# Patient Record
Sex: Female | Born: 1990 | Race: White | Hispanic: No | Marital: Single | Smoking: Never smoker
Health system: Southern US, Community
[De-identification: ages and names within clinical notes are randomized; demographics above are authoritative.]

## PROBLEM LIST (undated history)

## (undated) DIAGNOSIS — K219 Gastro-esophageal reflux disease without esophagitis: Secondary | ICD-10-CM

---
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
## 2011-04-10 ENCOUNTER — Emergency Department: Payer: Self-pay | Admitting: Emergency Medicine

## 2011-12-19 ENCOUNTER — Emergency Department: Payer: Self-pay | Admitting: *Deleted

## 2014-02-24 ENCOUNTER — Emergency Department: Payer: Self-pay | Admitting: Internal Medicine

## 2014-04-30 ENCOUNTER — Emergency Department: Payer: Self-pay | Admitting: Internal Medicine

## 2014-07-25 ENCOUNTER — Emergency Department: Payer: Self-pay | Admitting: Emergency Medicine

## 2014-12-29 ENCOUNTER — Ambulatory Visit: Payer: Self-pay | Admitting: Family Medicine

## 2014-12-29 ENCOUNTER — Telehealth: Payer: Self-pay | Admitting: Family Medicine

## 2014-12-29 NOTE — Telephone Encounter (Signed)
Patient did not come in for their appointment today new pt appt.  Please let me know if patient needs to be contacted immediately for follow up or no follow up needed.

## 2014-12-29 NOTE — Telephone Encounter (Signed)
error 

## 2014-12-30 ENCOUNTER — Observation Stay
Admission: EM | Admit: 2014-12-30 | Discharge: 2014-12-30 | Disposition: A | Discharge status: Home / Self Care | Payer: 59 | Attending: Obstetrics and Gynecology | Admitting: Obstetrics and Gynecology

## 2014-12-30 ENCOUNTER — Observation Stay: Payer: 59 | Admitting: Registered Nurse

## 2014-12-30 ENCOUNTER — Encounter: Payer: Self-pay | Admitting: Emergency Medicine

## 2014-12-30 ENCOUNTER — Encounter
Admission: EM | Disposition: A | Discharge status: Home / Self Care | Payer: Self-pay | Source: Home / Self Care | Attending: Emergency Medicine

## 2014-12-30 ENCOUNTER — Emergency Department: Payer: 59

## 2014-12-30 DIAGNOSIS — N8329 Other ovarian cysts: Secondary | ICD-10-CM | POA: Insufficient documentation

## 2014-12-30 DIAGNOSIS — R102 Pelvic and perineal pain: Secondary | ICD-10-CM | POA: Diagnosis not present

## 2014-12-30 DIAGNOSIS — O209 Hemorrhage in early pregnancy, unspecified: Secondary | ICD-10-CM | POA: Diagnosis not present

## 2014-12-30 DIAGNOSIS — R1032 Left lower quadrant pain: Secondary | ICD-10-CM | POA: Insufficient documentation

## 2014-12-30 DIAGNOSIS — O009 Unspecified ectopic pregnancy without intrauterine pregnancy: Secondary | ICD-10-CM

## 2014-12-30 DIAGNOSIS — O3481 Maternal care for other abnormalities of pelvic organs, first trimester: Secondary | ICD-10-CM | POA: Insufficient documentation

## 2014-12-30 DIAGNOSIS — O001 Tubal pregnancy: Secondary | ICD-10-CM | POA: Diagnosis not present

## 2014-12-30 DIAGNOSIS — Z3A08 8 weeks gestation of pregnancy: Secondary | ICD-10-CM | POA: Diagnosis not present

## 2014-12-30 HISTORY — DX: Gastro-esophageal reflux disease without esophagitis: K21.9

## 2014-12-30 HISTORY — PX: DIAGNOSTIC LAPAROSCOPY WITH REMOVAL OF ECTOPIC PREGNANCY: SHX6449

## 2014-12-30 LAB — CBC WITH DIFFERENTIAL/PLATELET
BASOS ABS: 0.1 10*3/uL (ref 0–0.1)
BASOS PCT: 1 %
EOS ABS: 0.5 10*3/uL (ref 0–0.7)
Eosinophils Relative: 6 %
HCT: 37.1 % (ref 35.0–47.0)
Hemoglobin: 12.6 g/dL (ref 12.0–16.0)
LYMPHS ABS: 3.1 10*3/uL (ref 1.0–3.6)
LYMPHS PCT: 33 %
MCH: 29.7 pg (ref 26.0–34.0)
MCHC: 33.8 g/dL (ref 32.0–36.0)
MCV: 87.9 fL (ref 80.0–100.0)
MONO ABS: 0.6 10*3/uL (ref 0.2–0.9)
Monocytes Relative: 7 %
Neutro Abs: 5.2 10*3/uL (ref 1.4–6.5)
Neutrophils Relative %: 55 %
Platelets: 322 10*3/uL (ref 150–440)
RBC: 4.23 MIL/uL (ref 3.80–5.20)
RDW: 12.5 % (ref 11.5–14.5)
WBC: 9.4 10*3/uL (ref 3.6–11.0)

## 2014-12-30 LAB — COMPREHENSIVE METABOLIC PANEL
ALT: 23 U/L (ref 14–54)
AST: 18 U/L (ref 15–41)
Albumin: 4.2 g/dL (ref 3.5–5.0)
Alkaline Phosphatase: 33 U/L — ABNORMAL LOW (ref 38–126)
Anion gap: 7 (ref 5–15)
BUN: 16 mg/dL (ref 6–20)
CO2: 26 mmol/L (ref 22–32)
Calcium: 8.9 mg/dL (ref 8.9–10.3)
Chloride: 105 mmol/L (ref 101–111)
Creatinine, Ser: 0.62 mg/dL (ref 0.44–1.00)
GFR calc non Af Amer: >60 mL/min (ref >60)
GLUCOSE: 130 mg/dL — AB (ref 65–99)
Potassium: 3.7 mmol/L (ref 3.5–5.1)
Sodium: 138 mmol/L (ref 135–145)
Total Bilirubin: 0.3 mg/dL (ref 0.3–1.2)
Total Protein: 7.3 g/dL (ref 6.5–8.1)

## 2014-12-30 LAB — URINALYSIS COMPLETE WITH MICROSCOPIC (ARMC ONLY)
Bilirubin Urine: NEGATIVE
Glucose, UA: NEGATIVE mg/dL
Nitrite: NEGATIVE
Protein, ur: 30 mg/dL — AB
Specific Gravity, Urine: 1.033 — ABNORMAL HIGH (ref 1.005–1.030)
pH: 5 (ref 5.0–8.0)

## 2014-12-30 LAB — ABO/RH: ABO/RH(D): A POS

## 2014-12-30 LAB — HCG, QUANTITATIVE, PREGNANCY: hCG, Beta Chain, Quant, S: 2179 m[IU]/mL — ABNORMAL HIGH (ref <5)

## 2014-12-30 LAB — POCT PREGNANCY, URINE: PREG TEST UR: POSITIVE — AB

## 2014-12-30 SURGERY — LAPAROSCOPY, WITH ECTOPIC PREGNANCY SURGICAL TREATMENT
Anesthesia: General | Laterality: Left

## 2014-12-30 MED ORDER — ROCURONIUM BROMIDE 100 MG/10ML IV SOLN
INTRAVENOUS | Status: DC | PRN
  Administered 2014-12-30: 10 mg via INTRAVENOUS
  Administered 2014-12-30: 30 mg via INTRAVENOUS
  Administered 2014-12-30: 10 mg via INTRAVENOUS

## 2014-12-30 MED ORDER — HYDROMORPHONE HCL 1 MG/ML IJ SOLN
INTRAMUSCULAR | Status: AC
  Administered 2014-12-30: 0.5 mg via INTRAVENOUS
  Filled 2014-12-30: qty 1

## 2014-12-30 MED ORDER — ACETAMINOPHEN 10 MG/ML IV SOLN
INTRAVENOUS | Status: AC
  Filled 2014-12-30: qty 100

## 2014-12-30 MED ORDER — HYDROMORPHONE HCL 1 MG/ML IJ SOLN
0.2500 mg | INTRAMUSCULAR | Status: DC | PRN
  Administered 2014-12-30 (×4): 0.5 mg via INTRAVENOUS

## 2014-12-30 MED ORDER — BUPIVACAINE HCL 0.5 % IJ SOLN
INTRAMUSCULAR | Status: DC | PRN
  Administered 2014-12-30: 22 mL

## 2014-12-30 MED ORDER — OXYCODONE-ACETAMINOPHEN 5-325 MG PO TABS: 1.0000 | ORAL_TABLET | ORAL | Status: DC | PRN

## 2014-12-30 MED ORDER — OXYCODONE-ACETAMINOPHEN 5-325 MG PO TABS
1.0000 | ORAL_TABLET | ORAL | Status: DC | PRN
  Administered 2014-12-30 (×2): 1 via ORAL
  Filled 2014-12-30 (×2): qty 1

## 2014-12-30 MED ORDER — LACTATED RINGERS IR SOLN
Status: DC | PRN
  Administered 2014-12-30: 150 mL

## 2014-12-30 MED ORDER — ONDANSETRON HCL 4 MG/2ML IJ SOLN
4.0000 mg | Freq: Once | INTRAMUSCULAR | Status: AC | PRN
  Administered 2014-12-30: 4 mg via INTRAVENOUS
  Filled 2014-12-30: qty 2

## 2014-12-30 MED ORDER — MORPHINE SULFATE 4 MG/ML IJ SOLN
INTRAMUSCULAR | Status: AC
  Filled 2014-12-30: qty 1

## 2014-12-30 MED ORDER — GLYCOPYRROLATE 0.2 MG/ML IJ SOLN
INTRAMUSCULAR | Status: DC | PRN
  Administered 2014-12-30: 0.6 mg via INTRAVENOUS

## 2014-12-30 MED ORDER — BUPIVACAINE HCL (PF) 0.5 % IJ SOLN
INTRAMUSCULAR | Status: AC
  Filled 2014-12-30: qty 30

## 2014-12-30 MED ORDER — DIPHENHYDRAMINE HCL 50 MG/ML IJ SOLN
INTRAMUSCULAR | Status: AC
  Administered 2014-12-30: 25 mg via INTRAVENOUS
  Filled 2014-12-30: qty 1

## 2014-12-30 MED ORDER — DOXYCYCLINE HYCLATE 100 MG IV SOLR
100.0000 mg | INTRAVENOUS | Status: DC | PRN
  Administered 2014-12-30: 100 mg via INTRAVENOUS

## 2014-12-30 MED ORDER — KETOROLAC TROMETHAMINE 30 MG/ML IJ SOLN
INTRAMUSCULAR | Status: DC | PRN
  Administered 2014-12-30: 30 mg via INTRAVENOUS

## 2014-12-30 MED ORDER — MIDAZOLAM HCL 2 MG/2ML IJ SOLN
INTRAMUSCULAR | Status: DC | PRN
  Administered 2014-12-30: 2 mg via INTRAVENOUS

## 2014-12-30 MED ORDER — DOCUSATE SODIUM 100 MG PO CAPS: 100.0000 mg | ORAL_CAPSULE | Freq: Two times a day (BID) | ORAL | Status: DC

## 2014-12-30 MED ORDER — DIPHENHYDRAMINE HCL 50 MG/ML IJ SOLN
25.0000 mg | Freq: Once | INTRAMUSCULAR | Status: AC
  Administered 2014-12-30: 25 mg via INTRAVENOUS

## 2014-12-30 MED ORDER — FENTANYL CITRATE (PF) 100 MCG/2ML IJ SOLN
INTRAMUSCULAR | Status: DC | PRN
  Administered 2014-12-30: 50 ug via INTRAVENOUS
  Administered 2014-12-30: 100 ug via INTRAVENOUS
  Administered 2014-12-30: 50 ug via INTRAVENOUS

## 2014-12-30 MED ORDER — DOXYCYCLINE HYCLATE 100 MG IV SOLR
100.0000 mg | Freq: Once | INTRAVENOUS | Status: DC
  Filled 2014-12-30: qty 100

## 2014-12-30 MED ORDER — LIDOCAINE HCL (CARDIAC) 20 MG/ML IV SOLN
INTRAVENOUS | Status: DC | PRN
  Administered 2014-12-30: 80 mg via INTRAVENOUS

## 2014-12-30 MED ORDER — LACTATED RINGERS IV SOLN
INTRAVENOUS | Status: DC
  Administered 2014-12-30: 17:00:00 via INTRAVENOUS

## 2014-12-30 MED ORDER — HYDROMORPHONE HCL 1 MG/ML IJ SOLN
1.0000 mg | Freq: Once | INTRAMUSCULAR | Status: AC
  Administered 2014-12-30: 1 mg via INTRAVENOUS
  Filled 2014-12-30: qty 1

## 2014-12-30 MED ORDER — ONDANSETRON HCL 4 MG/2ML IJ SOLN
4.0000 mg | Freq: Once | INTRAMUSCULAR | Status: AC
  Administered 2014-12-30: 4 mg via INTRAVENOUS
  Filled 2014-12-30: qty 2

## 2014-12-30 MED ORDER — ACETAMINOPHEN 10 MG/ML IV SOLN
INTRAVENOUS | Status: DC | PRN
  Administered 2014-12-30: 1000 mg via INTRAVENOUS

## 2014-12-30 MED ORDER — DIPHENHYDRAMINE HCL 50 MG/ML IJ SOLN: 25.0000 mg | Freq: Four times a day (QID) | INTRAMUSCULAR | Status: DC | PRN

## 2014-12-30 MED ORDER — LACTATED RINGERS IV SOLN
INTRAVENOUS | Status: DC | PRN
  Administered 2014-12-30: 12:00:00 via INTRAVENOUS

## 2014-12-30 MED ORDER — SUCCINYLCHOLINE CHLORIDE 20 MG/ML IJ SOLN
INTRAMUSCULAR | Status: DC | PRN
  Administered 2014-12-30: 100 mg via INTRAVENOUS

## 2014-12-30 MED ORDER — ONDANSETRON HCL 4 MG/2ML IJ SOLN
INTRAMUSCULAR | Status: DC | PRN
  Administered 2014-12-30: 4 mg via INTRAVENOUS

## 2014-12-30 MED ORDER — PROPOFOL 10 MG/ML IV BOLUS
INTRAVENOUS | Status: DC | PRN
  Administered 2014-12-30: 200 mg via INTRAVENOUS

## 2014-12-30 MED ORDER — ONDANSETRON HCL 4 MG/2ML IJ SOLN
4.0000 mg | Freq: Once | INTRAMUSCULAR | Status: AC
  Administered 2014-12-30: 4 mg via INTRAVENOUS

## 2014-12-30 MED ORDER — HYDROMORPHONE HCL 1 MG/ML IJ SOLN: 0.5000 mg | INTRAMUSCULAR | Status: DC | PRN

## 2014-12-30 MED ORDER — METOCLOPRAMIDE HCL 5 MG/ML IJ SOLN: 10.0000 mg | Freq: Once | INTRAMUSCULAR | Status: DC | PRN

## 2014-12-30 MED ORDER — NEOSTIGMINE METHYLSULFATE 10 MG/10ML IV SOLN
INTRAVENOUS | Status: DC | PRN
  Administered 2014-12-30: 3 mg via INTRAVENOUS

## 2014-12-30 MED ORDER — FENTANYL CITRATE (PF) 100 MCG/2ML IJ SOLN
50.0000 ug | Freq: Once | INTRAMUSCULAR | Status: AC
  Administered 2014-12-30: 50 ug via INTRAVENOUS
  Filled 2014-12-30: qty 2

## 2014-12-30 MED ORDER — OXYCODONE-ACETAMINOPHEN 5-325 MG PO TABS: 1.0000 | ORAL_TABLET | Freq: Four times a day (QID) | ORAL | Status: DC | PRN

## 2014-12-30 MED ORDER — ONDANSETRON HCL 4 MG/2ML IJ SOLN
INTRAMUSCULAR | Status: AC
  Filled 2014-12-30: qty 2

## 2014-12-30 MED ORDER — DEXAMETHASONE SODIUM PHOSPHATE 4 MG/ML IJ SOLN
INTRAMUSCULAR | Status: DC | PRN
  Administered 2014-12-30: 8 mg via INTRAVENOUS

## 2014-12-30 MED ORDER — FENTANYL CITRATE (PF) 100 MCG/2ML IJ SOLN
50.0000 ug | Freq: Once | INTRAMUSCULAR | Status: AC
Start: 2014-12-30 — End: 2014-12-30
  Administered 2014-12-30: 50 ug via INTRAVENOUS
  Filled 2014-12-30: qty 2

## 2014-12-30 MED ORDER — LACTATED RINGERS IV SOLN: INTRAVENOUS | Status: DC

## 2014-12-30 MED ORDER — MORPHINE SULFATE 4 MG/ML IJ SOLN
4.0000 mg | Freq: Once | INTRAMUSCULAR | Status: AC
  Administered 2014-12-30: 4 mg via INTRAVENOUS
  Filled 2014-12-30: qty 1

## 2014-12-30 SURGICAL SUPPLY — 52 items
11 MM BLADLESS TROCAR ×3 IMPLANT
BAG URO DRAIN 2000ML W/SPOUT (MISCELLANEOUS) ×3 IMPLANT
BLADE SURG 15 STRL LF DISP TIS (BLADE) ×1 IMPLANT
BLADE SURG 15 STRL SS (BLADE) ×2
CANISTER SUCT 1200ML W/VALVE (MISCELLANEOUS) ×3 IMPLANT
CATH FOLEY 2WAY  5CC 16FR (CATHETERS) ×2
CATH ROBINSON RED A/P 16FR (CATHETERS) ×3 IMPLANT
CATH URTH 16FR FL 2W BLN LF (CATHETERS) ×1 IMPLANT
CHLORAPREP W/TINT 26ML (MISCELLANEOUS) ×3 IMPLANT
CORD MONOPOLAR M/FML 12FT (MISCELLANEOUS) ×3 IMPLANT
DEFOGGER SCOPE WARMER CLEARIFY (MISCELLANEOUS) ×3 IMPLANT
DRESSING SURGICEL FIBRLLR 1X2 (HEMOSTASIS) ×1 IMPLANT
DRESSING TELFA 4X3 1S ST N-ADH (GAUZE/BANDAGES/DRESSINGS) ×3 IMPLANT
DRSG SURGICEL FIBRILLAR 1X2 (HEMOSTASIS) ×3
DRSG TEGADERM 2-3/8X2-3/4 SM (GAUZE/BANDAGES/DRESSINGS) ×12 IMPLANT
ENDOPOUCH RETRIEVER 10 (MISCELLANEOUS) IMPLANT
GAUZE SPONGE NON-WVN 2X2 STRL (MISCELLANEOUS) ×2 IMPLANT
GLOVE BIO SURGEON STRL SZ7 (GLOVE) ×6 IMPLANT
GLOVE INDICATOR 7.5 STRL GRN (GLOVE) ×3 IMPLANT
GOWN STRL REUS W/ TWL LRG LVL3 (GOWN DISPOSABLE) ×1 IMPLANT
GOWN STRL REUS W/ TWL XL LVL3 (GOWN DISPOSABLE) ×1 IMPLANT
GOWN STRL REUS W/TWL LRG LVL3 (GOWN DISPOSABLE) ×2
GOWN STRL REUS W/TWL XL LVL3 (GOWN DISPOSABLE) ×2
GRASPER SUT TROCAR 14GX15 (MISCELLANEOUS) ×3 IMPLANT
IRRIGATION STRYKERFLOW (MISCELLANEOUS) ×1 IMPLANT
IRRIGATOR STRYKERFLOW (MISCELLANEOUS) ×3
IV LACTATED RINGERS 1000ML (IV SOLUTION) ×3 IMPLANT
KII SLEEVE ×3 IMPLANT
KIT PINK PAD W/HEAD ARE REST (MISCELLANEOUS) ×3
KIT PINK PAD W/HEAD ARM REST (MISCELLANEOUS) ×1 IMPLANT
KIT RM TURNOVER CYSTO AR (KITS) ×3 IMPLANT
LABEL OR SOLS (LABEL) ×3 IMPLANT
LIGASURE BLUNT 5MM 37CM (INSTRUMENTS) ×3 IMPLANT
LIQUID BAND (GAUZE/BANDAGES/DRESSINGS) ×3 IMPLANT
NS IRRIG 500ML POUR BTL (IV SOLUTION) ×3 IMPLANT
PACK GYN LAPAROSCOPIC (MISCELLANEOUS) ×3 IMPLANT
PAD OB MATERNITY 4.3X12.25 (PERSONAL CARE ITEMS) ×3 IMPLANT
PAD PREP 24X41 OB/GYN DISP (PERSONAL CARE ITEMS) ×3 IMPLANT
POUCH ENDO CATCH 10MM SPEC (MISCELLANEOUS) ×3 IMPLANT
SCISSORS METZENBAUM CVD 33 (INSTRUMENTS) ×3 IMPLANT
SLEEVE ENDOPATH XCEL 5M (ENDOMECHANICALS) IMPLANT
SPONGE VERSALON 2X2 STRL (MISCELLANEOUS) ×4
SUT VIC AB 4-0 PS2 18 (SUTURE) ×3 IMPLANT
SUT VICRYL 0 AB UR-6 (SUTURE) ×3 IMPLANT
SYRINGE 10CC LL (SYRINGE) ×3 IMPLANT
TROCAR 130MM GELPORT  DAV (MISCELLANEOUS) ×3 IMPLANT
TROCAR BLUNT TIP 12MM OMST12BT (TROCAR) IMPLANT
TROCAR XCEL NON-BLD 11X100MML (ENDOMECHANICALS) ×6 IMPLANT
TROCAR XCEL NON-BLD 5MMX100MML (ENDOMECHANICALS) ×3 IMPLANT
TROCAR XCEL UNIV SLVE 11M 100M (ENDOMECHANICALS) IMPLANT
TROCAR Z-THREAD OPTICAL 5X100M (TROCAR) ×3 IMPLANT
TUBING INSUFFLATOR HI FLOW (MISCELLANEOUS) ×3 IMPLANT

## 2014-12-30 NOTE — ED Provider Notes (Signed)
[facility] Emergency Department Provider Note  ____________________________________________  Time seen:   I have reviewed the triage vital signs and the nursing notes.   HISTORY  Chief Complaint Abdominal Pain and Nausea      HPI Brittany Cabrera is a 24 y.o. female presents with pelvic pain and nausea 2 days. Patient unsure as to last menstrual period states that her menses have been very regular however she states that she's had vaginal spotting since Friday intermittently. Patient denies any fever no vomiting or diarrhea.   Past medical history 1 previous pregnancy vaginal delivery   There are no active problems to display for this patient.   Past surgical history None No current outpatient prescriptions on file.  Allergies Review of patient's allergies indicates no known allergies.  No family history on file.  Social History History  Substance Use Topics  . Smoking status: Never Smoker   . Smokeless tobacco: Never Used  . Alcohol Use: Yes    Review of Systems  Constitutional: Negative for fever. Eyes: Negative for visual changes. ENT: Negative for sore throat. Cardiovascular: Negative for chest pain. Respiratory: Negative for shortness of breath. Gastrointestinal: Positive for pelvic pain Genitourinary: Negative for dysuria. Musculoskeletal: Negative for back pain. Skin: Negative for rash. Neurological: Negative for headaches, focal weakness or numbness.   10-point ROS otherwise negative.  ____________________________________________   PHYSICAL EXAM:  VITAL SIGNS: ED Triage Vitals  Enc Vitals Group     BP 12/30/14 0218 106/65 mmHg     Pulse Rate 12/30/14 0218 77     Resp 12/30/14 0218 18     Temp 12/30/14 0218 98.1 F (36.7 C)     Temp Source 12/30/14 0218 Oral     SpO2 12/30/14 0218 99 %     Weight 12/30/14 0218 210 lb (95.255 kg)     Height 12/30/14 0218  (1.6 m)     Head Cir --      Peak Flow --       Pain Score 12/30/14 0219 9     Pain Loc --      Pain Edu? --      Excl. in GC? --      Constitutional: Alert and oriented. Well appearing and in no distress. Eyes: Conjunctivae are normal. PERRL. Normal extraocular movements. ENT   Head: Normocephalic and atraumatic.   Nose: No congestion/rhinnorhea.   Mouth/Throat: Mucous membranes are moist.   Neck: No stridor. Cardiovascular: Normal rate, regular rhythm. Normal and symmetric distal pulses are present in all extremities. No murmurs, rubs, or gallops. Respiratory: Normal respiratory effort without tachypnea nor retractions. Breath sounds are clear and equal bilaterally. No wheezes/rales/rhonchi. Gastrointestinal: Right lower quadrant/left lower quadrant and suprapubic pain with palpation.. No distention. There is no CVA tenderness. Genitourinary: deferred Musculoskeletal: Nontender with normal range of motion in all extremities. No joint effusions.  No lower extremity tenderness nor edema. Neurologic:  Normal speech and language. No gross focal neurologic deficits are appreciated. Speech is normal.  Skin:  Skin is warm, dry and intact. No rash noted. Psychiatric: Mood and affect are normal. Speech and behavior are normal. Patient exhibits appropriate insight and judgment.  ____________________________________________    LABS (pertinent positives/negatives)  Labs Reviewed  COMPREHENSIVE METABOLIC PANEL - Abnormal; Notable for the following:    Glucose, Bld 130 (*)    Alkaline Phosphatase 33 (*)    All other components within normal limits  URINALYSIS COMPLETEWITH MICROSCOPIC ([facility] ONLY) - Abnormal; Notable for the following:  Color, Urine YELLOW (*)    APPearance HAZY (*)    Ketones, ur TRACE (*)    Specific Gravity, Urine 1.033 (*)    Hgb urine dipstick 3+ (*)    Protein, ur 30 (*)    Leukocytes, UA TRACE (*)    Bacteria, UA RARE (*)    Squamous Epithelial / LPF 0-5 (*)    All other components within  normal limits  POCT PREGNANCY, URINE - Abnormal; Notable for the following:    Preg Test, Ur POSITIVE (*)    All other components within normal limits  CBC WITH DIFFERENTIAL/PLATELET  HCG, QUANTITATIVE, PREGNANCY        RADIOLOGY  Pelvic ultrasound revealed: IMPRESSION: 1. No IUP is observed. There are no findings to suggest retained products of conception. 2. There is a complex left adnexal mass measuring 3.1 x 2.4 x 2.7 with some increased vascularity without a classic ring of fire. This may reflect an ectopic pregnancy. 3. The ovaries are normal in size and echotexture. 4. There is no free pelvic fluid. These results were called by telephone at the time of interpretation on 12/30/2014 at 7:40 am to Dr. Dorothea Glassman, who verbally acknowledged these results.    Critical Care performed: 30 minutes  ____________________________________________   INITIAL IMPRESSION / ASSESSMENT AND PLAN / ED COURSE  Pertinent labs & imaging results that were available during my care of the patient were reviewed by me and considered in my medical decision making (see chart for details).    ____________________________________________   FINAL CLINICAL IMPRESSION(S) / ED DIAGNOSES  Final diagnoses:  Ectopic pregnancy      Darci Current, MD 12/31/14 [phone]

## 2014-12-30 NOTE — Progress Notes (Signed)
GYN Note Chart reviewed.  Concerning for left sided ectopic. Plan of care discussed with patient and recommend left salpingectomy, if all other anatomy is normal and D&C for thickened endometrial stripe. Pt amenable to plan. Will proceed for surgery at around noon. Pain stable. NPO since 0500 (water for u/s) and no food since yesterday. Desires nothing for [facility] after procedure. Rh pos, CBC normal  Cornelia Copa MD [facility]  Pager: [phone]

## 2014-12-30 NOTE — Discharge Instructions (Signed)
Call for a 2-3 week post op visit with Dr. Vergie Living.   Brittany Cabrera Laparoscopic Surgery Discharge Instructions  Instructions Following Laparoscopic Surgery You have just undergone a major laparoscopic surgery.  The following list should answer your most common questions.  Although we will discuss your surgery and post-operative instructions with you prior to your discharge, this list will serve as a reminder if you fail to recall the details of what we discussed.  We will discuss your surgery once again in detail at your post-op visit in two to four weeks. If you havent already done so, please call to make your appointment as soon as possible.  How you will feel: Although you have just undergone a major surgery, your recovery will be significantly shorter since the surgery was performed through much smaller incisions than the traditional approach.  You should feel slightly better each day.  If you suddenly feel much worse than the prior day, please call the clinic.  Its important during the early part of your recovery that you maintain some activity.  Walking is encouraged.  You will quicken your recovery by continued activity.  Incision:  Your incisions will be closed with dissolvable stitches or surgical adhesive (glue).  There may be Band-aids and/or Steri-strips covering your incisions.  If there is no drainage from the incisions you may remove the Band-aids in one to two days.  You may notice some minor bruising at the incision sites.  This is common and will resolve within several days.  Please inform us if the redness at the edges of your incision appears to be spreading.  If the skin around your incision becomes warm to the touch, or if you notice a pus-like drainage, please call the office.  Sexual Activity: Do not have sexual intercourse or place tampons or douches in the vagina prior to your first office visit.  We will discuss when you may resume these activities at that visit.     Stairs/Driving/Activities: You may climb stairs if necessary.  If youve had general anesthesia, do not drive a car the rest of the day today.  You may begin light housework when you feel up to it, but avoid heavy lifting (more than 15-20lbs) or pushing until cleared for these activities by your physician.  Hygiene:  Do not soak your incisions.  Showers are acceptable but you may not take a bath or swim in a pool.  Cleanse your incisions daily with soap and water.  Medications:  Please resume taking any medications that you were taking prior to the surgery.  If we have prescribed any new medications for you, please take them as directed.  Constipation:  It is fairly common to experience some difficulty in moving your bowels following major surgery.  Being active will help to reduce this likelihood. A diet rich in fiber and plenty of liquids is desirable.  If you do become constipated, a mild laxative such as Miralax, Milk of Magnesia, or Metamucil, or a stool softener such as Colace, is recommended.  General Instructions: If you develop a fever of 100.5 degrees or higher, please call the office number(s) below for physician on call.    We will discuss your surgery once again in detail at your post-op visit in two to four weeks. If you havent already done so, please call to make your appointment as soon as possible.  [facility]  [address]  [place], [place] [address] [place], [place] [address]  Phone:  [phone] Phone: [phone]  Fax: [phone] Fax: [phone]

## 2014-12-30 NOTE — Progress Notes (Signed)
Vital signs WDL. Tolerating food, liquids, and PO meds. Voiding adequately. Pain well controlled. Incisions WDL, minimal vaginal bleeding. Patient discharged to home via wheelchair escorted by nursing. Prescriptions and discharged instructions given to and reviewed with patient. Patient verbalized understanding of all instructions.

## 2014-12-30 NOTE — ED Notes (Signed)
Pt uprite on stretcher in exam room with no distress noted; SO at bedside; pt reports left lower abd pain since yesterday accomp by nausea; describes pain as dull; +BS, abd soft/nondist, tender left lower; st currently menstruating

## 2014-12-30 NOTE — ED Notes (Signed)
Pt to u/s via w/c

## 2014-12-30 NOTE — Anesthesia Postprocedure Evaluation (Signed)
  Anesthesia Post-op Note  Patient: Brittany Cabrera  Procedure(s) Performed: Procedure(s): D7C, DIAGNOSTIC LAPAROSCOPY, LEFT SALPINGECTOMY, RIGHT PARA OVARIAN CYSTECTOMY (Left)  Anesthesia type:General  Patient location: PACU  Post pain: Pain level controlled  Post assessment: Post-op Vital signs reviewed, Patient's Cardiovascular Status Stable, Respiratory Function Stable, Patent Airway and No signs of Nausea or vomiting  Post vital signs: Reviewed and stable  Last Vitals:  Filed Vitals:   12/30/14 1449  BP:   Pulse: 71  Temp: 36.8 C  Resp:     Level of consciousness: awake, alert  and patient cooperative  Complications: No apparent anesthesia complications

## 2014-12-30 NOTE — Progress Notes (Signed)
Pt c/o itching. Benadryl given as ordered.

## 2014-12-30 NOTE — Anesthesia Postprocedure Evaluation (Signed)
  Anesthesia Post-op Note  Patient: Brittany Cabrera  Procedure(s) Performed: Procedure(s): D7C, DIAGNOSTIC LAPAROSCOPY, LEFT SALPINGECTOMY, RIGHT PARA OVARIAN CYSTECTOMY (Left)  Anesthesia type:General  Patient location: PACU  Post pain: Pain level controlled  Post assessment: Post-op Vital signs reviewed, Patient's Cardiovascular Status Stable, Respiratory Function Stable, Patent Airway and No signs of Nausea or vomiting  Post vital signs: Reviewed and stable  Last Vitals:  Filed Vitals:   12/30/14 1146  BP: 113/73  Pulse: 72  Temp: 37 C  Resp: 18    Level of consciousness: awake, alert  and patient cooperative  Complications: No apparent anesthesia complications

## 2014-12-30 NOTE — ED Notes (Signed)
Pt to the restroom w/ episode of vomting - pt given IVP zofran per protocol

## 2014-12-30 NOTE — ED Notes (Signed)
MD at bedside. 

## 2014-12-30 NOTE — ED Notes (Signed)
Pt presents to ED with left lower abd pain and nausea. Denies vomiting. Pt reports her pain is sharp and cramping in nature; pain radiates up her side and around her back.

## 2014-12-30 NOTE — ED Notes (Signed)
Pt states she is still unable to provide urine sample.  

## 2014-12-30 NOTE — Progress Notes (Signed)
Pt back from surgery; in room 349

## 2014-12-30 NOTE — ED Provider Notes (Signed)
Ultrasound comes back extremely suspicious for ectopic there is no pregnancy in the uterus there is a mass 3.1 cm the adnexa. [facility] called comes in to examine the patient will admit the patient to work on the ectopic.  Arnaldo Natal, MD 12/30/14 [phone]

## 2014-12-30 NOTE — Op Note (Addendum)
Operative Note   12/30/2014  PRE-OP DIAGNOSIS: Vaginal bleeding, left lower quadrant pain, left adnexal mass, positive pregnancy test, negative endometrium on ultrasound   POST-OP DIAGNOSIS: Same. Left tubal ectopic pregnancy. Right para-ovarian cyst  SURGEON: Surgeon(s) and Role:    * [place] Bing, MD - Primary  ASSISTANT: none  ANESTHESIA: General and local  PROCEDURE: Dilation and curettage, left salpingectomy, right para-ovarian cystectomy  ESTIMATED BLOOD LOSS: 25mL intra-operatively. of blood in the abdomen at the start of the case  DRAINS: indwelling foley UOP   TOTAL IV FLUIDS: crystalloid  SPECIMENS: endometrial curettings, left fallopian tube, right para-ovarian cyst  VTE PROPHYLAXIS: SCDs to the bilateral lower extremities  ANTIBIOTICS: Doxycycline 100mg  IV pre operatively  COMPLICATIONS: None  DISPOSITION: PACU - hemodynamically stable.  CONDITION: stable  FINDINGS: Exam under anesthesia revealed an anteverted uterus approximately 8 week size, normal shape, and no adnexal masses; normal EGBUS, cervix and vagina. Laparoscopic survey of the abdomen revealed no intra-abdominal adhesions, a grossly normal uterus, right fallopian tube, bilateral ovaries. Left dilated fallopian tube with adherent blood clot on the fimbriae. Old blood in the posterior and anterior cul-de-sac. 1-1.5cm two para ovarian right cysts, with clear fluid and thin walled (benign appearing)  PROCEDURE IN DETAIL: The patient was taken to the OR where anesthesia was administed. The patient was positioned in dorsal lithotomy in the [place] stirrups. The patient was then examined under anesthesia with the above noted findings. The patient was prepped and draped in the normal sterile fashion and foley catheter was placed. A Graves speculum was placed in the vagina and the anterior lip of the cervix was grasped with a single toothed tenaculum.  A gentle uterine curettage was done and a  hulka uterine manipulator was then inserted in the uterus and uterine mobility was found to be satisfactory; the speculum was then removed.  After changing gloves, attention was turned to the patient's abdomen where a 10 mm skin incision was made in the umbilical fold, after injection of local anesthesia. A skin incision was made and using the open technique, the abdomen was entered, a 12mm umbilical port placed and the laparoscope introduced and pneumoperitoneum obtained with the obtained findings, after inspection below the entry site and Trendelenburg done. A suprapubic and bilateral lower quadrant ports were placed under direct visualization and after injection of local and all these ports were 5mm.  Using the Ligasure, the left fallopian tube was removed and the suction irrigator was used to remove old blood in the abdomen. Next, the para ovarian cyst was examined and noted to be distinct and separate from the right ovary and tube and was gently removed with the monopolar scissors, gentle cut current and then the Ligasure, once a good margin from the tube was obtained.  Pressure was dropped to and the bilateral operative sites were inspected and the left side was normal and right side was slightly oozy with hemostasis obtained with coag current with the scissors and fibrillar.  Pressure was then put back to and using the PMI device, the umbilical fascia was closed, the ports removed under direct visualization and then the gas released before the last one was removed. The umbilical fasica was then tied and confirmed to be closed and the umbilical and suprapubic ports closed with 4-0 vicryl in a subcuticular fashion and then all the ports had skin glue applied.    Hulka was then removed and excellent hemostasis seen with the speculum, and tThe foley catheter  was removed. The patient tolerated the procedure well. All counts were correct x 2. The patient was transferred to the recovery room awake,  alert and breathing independently.   Cornelia Copa MD [facility]  Pager: [phone]

## 2014-12-30 NOTE — Transfer of Care (Signed)
Immediate Anesthesia Transfer of Care Note  Patient: Brittany Cabrera  Procedure(s) Performed: Procedure(s): D7C, DIAGNOSTIC LAPAROSCOPY, LEFT SALPINGECTOMY, RIGHT PARA OVARIAN CYSTECTOMY (Left)  Patient Location: PACU  Anesthesia Type:General  Level of Consciousness: sedated  Airway & Oxygen Therapy: Patient Spontanous Breathing and Patient connected to face mask oxygen  Post-op Assessment: Report given to RN and Post -op Vital signs reviewed and stable  Post vital signs: Reviewed and stable  Last Vitals:  Filed Vitals:   12/30/14 1449  BP: 112/64  Pulse: 71  Temp: 36.8 C  Resp: 16    Complications: No apparent anesthesia complications

## 2014-12-30 NOTE — H&P (Signed)
Obstetrics & Gynecology Consultation Note  Date of Consultation: 12/30/2014   Requesting Provider: [facility]  Primary OBGYN: None.  Seen [facility] 2 years ago. Primary Care Provider: No PCP Per Patient  Reason for Consultation: Left Ectopic Pregnancy  History of Present Illness: Ms. Hilgeman is a 24 y.o. G2P1. (Patient's last menstrual period was 12/25/2014.), with the above CC. Pt started bleeding but it was different from period, w worsening bleeding today.  More importantly, she awoke with severe LLQ pain during the night.  No n/v/f/c.  No prior medical problems.  NSVD x1 eight years ago.  Beta 2100 [place]- empty uterus, 3cm left adnexal mass adjacent to ovary  ROS: A 12-point review of systems was performed and negative, except as stated in the above HPI.  OBGYN History: As per HPI. OB History    No data available     no history of abnormal pap smears (last pap smear: 2 years ago, which was normal.) no history of STIs.   She is currently using nothing for contraception.  no HRT use.    Past Medical History: History reviewed. No pertinent past medical history.  Past Surgical History: History reviewed. No pertinent past surgical history.  Family History:  No family history on file. She denies any female cancers, bleeding or blood clotting disorders.   Social History:  History   Social History  . Marital Status: Single    Spouse Name: N/A  . Number of Children: N/A  . Years of Education: N/A   Occupational History  . Not on file.   Social History Main Topics  . Smoking status: Never Smoker   . Smokeless tobacco: Never Used  . Alcohol Use: Yes  . Drug Use: No  . Sexual Activity: Not on file   Other Topics Concern  . Not on file   Social History Narrative  . No narrative on file    Health Maintenance:  Mammogram no, ;  Colonoscopy no ,  Flu shot no   Allergy: No Known Allergies  Current Outpatient Medications:  (Not in a hospital admission)   Hospital  Medications: No current facility-administered medications for this encounter.   No current outpatient prescriptions on file.     Physical Exam: Filed Vitals:   12/30/14 0218 12/30/14 0542 12/30/14 0807  BP: 106/65 108/60 109/74  Pulse: 77 71 65  Temp: 98.1 F (36.7 C)    TempSrc: Oral    Resp: 18 20   Height:  (1.6 m)    Weight: 95.255 kg (210 lb)    SpO2: 99% 100% 100%    Temp:  [98.1 F (36.7 C)] 98.1 F (36.7 C) (08/03 0218) Pulse Rate:  [65-77] 65 (08/03 0807) Resp:  [18-20] 20 (08/03 0542) BP: (106-109)/(60-74) 109/74 mmHg (08/03 0807) SpO2:  [99 %-100 %] 100 % (08/03 0807) Weight:  [95.255 kg (210 lb)] 95.255 kg (210 lb) (08/03 0218)     No intake or output data in the 24 hours ending 12/30/14 0833   Current Vital Signs 24h Vital Sign Ranges  T 98.1 F (36.7 C) Temp  Avg: 98.1 F (36.7 C)  Min: 98.1 F (36.7 C)  Max: 98.1 F (36.7 C)  BP 109/74 mmHg BP  Min: 106/65  Max: 109/74  HR 65 Pulse  Avg: 71  Min: 65  Max: 77  RR 20 Resp  Avg: 19  Min: 18  Max: 20  SaO2 100 % Not Delivered SpO2  Avg: 99.7 %  Min: 99 %  Max: 100 %       24 Hour I/O Current Shift I/O  Time Ins Outs       Patient Vitals for the past 8 hrs:  BP Temp Temp src Pulse Resp SpO2 Height Weight  12/30/14 0807 109/74 mmHg - - 65 - 100 % - -  12/30/14 0542 108/60 mmHg - - 71 20 100 % - -  12/30/14 0218 106/65 mmHg 98.1 F (36.7 C) Oral 77 18 99 % 5\' 3"  (1.6 m) 95.255 kg (210 lb)    Body mass index is 37.21 kg/(m^2). General appearance: Well nourished, well developed female in no acute distress.  Neck:  Supple, normal appearance, and no thyromegaly  Cardiovascular:Regular rate and rhythm.  No murmurs, rubs or gallops. Respiratory:  Clear to auscultation bilateral. Normal respiratory effort Abdomen: positive bowel sounds and no masses, hernias; diffusely tender to palpation LLQ>RLQ, non distended Neuro/Psych:  Normal mood and affect.  Skin:  Warm and dry.  Lymphatic:  No inguinal  lymphadenopathy.   Laboratory: Beta HCG: 2100    Recent Labs Lab 12/30/14 0226  WBC 9.4  HGB 12.6  HCT 37.1  PLT 322    Recent Labs Lab 12/30/14 0226  NA 138  K 3.7  CL 105  CO2 26  BUN 16  CREATININE 0.62  CALCIUM 8.9  PROT 7.3  BILITOT 0.3  ALKPHOS 33*  ALT 23  AST 18  GLUCOSE 130*   No results for input(s): APTT, INR, PTT in the last 168 hours.  Invalid input(s): DRHAPTT  Recent Labs Lab 12/30/14 0226  ABORH A POS    Imaging:  See [place] report  Assessment: Ms. Lecy is a 24 y.o. G2P1 (Patient's last menstrual period was 12/25/2014.) who presented to the ED with complaints of LLQ PAIN, Abnormal Bleeding; findings are consistent with Left Ectopic Pregnancy.  Plan: Options and risks discussed. Methotrexate discussed but not best option due to severity of pain. Laparoscopy discussed, pros and cons, salpingostomy vs salpingectomy, future fertility, recovery.  Desires.   NPO. Consent.  Plan surgery soon.  Pt stable. Discussed that Dr Vergie Living will be doing surgery as I am going off of call.    Annamarie Major, MD [facility] Pager [phone]

## 2014-12-30 NOTE — Progress Notes (Signed)
Pt to OR at this time.

## 2014-12-30 NOTE — Discharge Summary (Signed)
Gynecology Discharge Summary Date of Admission: 12/30/2014 Date of Discharge: 12/30/2014  The patient was admitted, as scheduled, and underwent a D&C, laparoscopic left salpingectomy, right para-ovarian cystectomy for left sided ectopic pregnancy and vaginal bleeding; please refer to operative note for full details.  She was meeting all post op goals and discharged to home on POD#0    Medication List    TAKE these medications        docusate sodium 100 MG capsule  Commonly known as:  COLACE  Take 1 capsule (100 mg total) by mouth 2 (two) times daily.     ibuprofen 200 MG tablet  Commonly known as:  ADVIL,MOTRIN  Take 800 mg by mouth every 6 (six) hours as needed.     oxyCODONE-acetaminophen 5-325 MG per tablet  Commonly known as:  ROXICET  Take 1 tablet by mouth every 6 (six) hours as needed for severe pain.        Patient was told to call for appointment in 2-4 weeks  Cornelia Copa MD [facility]  Pager: [phone]

## 2014-12-30 NOTE — Anesthesia Preprocedure Evaluation (Addendum)
Anesthesia Evaluation  Patient identified by MRN, date of birth, ID band Patient awake    Reviewed: Allergy & Precautions, NPO status , Patient's Chart, lab work & pertinent test results  Airway Mallampati: I  TM Distance: >3 FB Neck ROM: Full    Dental  (+) Teeth Intact   Pulmonary    Pulmonary exam normal       Cardiovascular Exercise Tolerance: Good negative cardio ROS Normal cardiovascular exam    Neuro/Psych    GI/Hepatic   Endo/Other    Renal/GU      Musculoskeletal   Abdominal   Peds  Hematology   Anesthesia Other Findings   Reproductive/Obstetrics                             Anesthesia Physical Anesthesia Plan  ASA: III and emergent  Anesthesia Plan: General   Post-op Pain Management:    Induction: Intravenous  Airway Management Planned: Oral ETT  Additional Equipment:   Intra-op Plan:   Post-operative Plan: Extubation in OR  Informed Consent: I have reviewed the patients History and Physical, chart, labs and discussed the procedure including the risks, benefits and alternatives for the proposed anesthesia with the patient or authorized representative who has indicated his/her understanding and acceptance.     Plan Discussed with: CRNA  Anesthesia Plan Comments:         Anesthesia Quick Evaluation

## 2014-12-31 ENCOUNTER — Encounter: Payer: Self-pay | Admitting: Obstetrics and Gynecology

## 2014-12-31 LAB — URINE CULTURE

## 2015-01-01 LAB — SURGICAL PATHOLOGY

## 2015-01-01 NOTE — Anesthesia Postprocedure Evaluation (Signed)
  Anesthesia Post-op Note  Patient: Brittany Cabrera  Procedure(s) Performed: Procedure(s): D7C, DIAGNOSTIC LAPAROSCOPY, LEFT SALPINGECTOMY, RIGHT PARA OVARIAN CYSTECTOMY (Left)  Anesthesia type:General  Patient location: PACU  Post pain: Pain level controlled  Post assessment: Post-op Vital signs reviewed, Patient's Cardiovascular Status Stable, Respiratory Function Stable, Patent Airway and No signs of Nausea or vomiting  Post vital signs: Reviewed and stable  Last Vitals:  Filed Vitals:   12/30/14 2008  BP: 106/60  Pulse: 82  Temp: 36.8 C  Resp: 24    Level of consciousness: awake, alert  and patient cooperative  Complications: No apparent anesthesia complications

## 2015-10-15 ENCOUNTER — Emergency Department: Payer: Self-pay

## 2015-10-15 ENCOUNTER — Emergency Department
Admission: EM | Admit: 2015-10-15 | Discharge: 2015-10-15 | Disposition: A | Discharge status: Home / Self Care | Payer: Self-pay | Attending: Emergency Medicine | Admitting: Emergency Medicine

## 2015-10-15 DIAGNOSIS — Z791 Long term (current) use of non-steroidal anti-inflammatories (NSAID): Secondary | ICD-10-CM | POA: Insufficient documentation

## 2015-10-15 DIAGNOSIS — Z79899 Other long term (current) drug therapy: Secondary | ICD-10-CM | POA: Insufficient documentation

## 2015-10-15 DIAGNOSIS — N390 Urinary tract infection, site not specified: Secondary | ICD-10-CM | POA: Insufficient documentation

## 2015-10-15 DIAGNOSIS — R102 Pelvic and perineal pain: Secondary | ICD-10-CM

## 2015-10-15 LAB — URINALYSIS COMPLETE WITH MICROSCOPIC (ARMC ONLY)
BILIRUBIN URINE: NEGATIVE
Glucose, UA: NEGATIVE mg/dL
Hgb urine dipstick: NEGATIVE
KETONES UR: NEGATIVE mg/dL
Nitrite: NEGATIVE
PH: 6 (ref 5.0–8.0)
PROTEIN: NEGATIVE mg/dL
SPECIFIC GRAVITY, URINE: 1.015 (ref 1.005–1.030)

## 2015-10-15 LAB — CBC WITH DIFFERENTIAL/PLATELET
Basophils Absolute: 0 10*3/uL (ref 0–0.1)
Basophils Relative: 1 %
EOS ABS: 0.5 10*3/uL (ref 0–0.7)
EOS PCT: 6 %
HCT: 42 % (ref 35.0–47.0)
Hemoglobin: 14.1 g/dL (ref 12.0–16.0)
Lymphocytes Relative: 23 %
Lymphs Abs: 1.8 10*3/uL (ref 1.0–3.6)
MCH: 29.8 pg (ref 26.0–34.0)
MCHC: 33.7 g/dL (ref 32.0–36.0)
MCV: 88.5 fL (ref 80.0–100.0)
MONO ABS: 0.4 10*3/uL (ref 0.2–0.9)
Monocytes Relative: 6 %
NEUTROS ABS: 5 10*3/uL (ref 1.4–6.5)
NEUTROS PCT: 64 %
PLATELETS: 305 10*3/uL (ref 150–440)
RBC: 4.74 MIL/uL (ref 3.80–5.20)
RDW: 12.7 % (ref 11.5–14.5)
WBC: 7.8 10*3/uL (ref 3.6–11.0)

## 2015-10-15 LAB — COMPREHENSIVE METABOLIC PANEL
ALT: 19 U/L (ref 14–54)
AST: 17 U/L (ref 15–41)
Albumin: 4.4 g/dL (ref 3.5–5.0)
Alkaline Phosphatase: 38 U/L (ref 38–126)
Anion gap: 6 (ref 5–15)
BUN: 13 mg/dL (ref 6–20)
CHLORIDE: 106 mmol/L (ref 101–111)
CO2: 26 mmol/L (ref 22–32)
CREATININE: 0.76 mg/dL (ref 0.44–1.00)
Calcium: 9.1 mg/dL (ref 8.9–10.3)
GFR calc non Af Amer: >60 mL/min (ref >60)
Glucose, Bld: 121 mg/dL — ABNORMAL HIGH (ref 65–99)
POTASSIUM: 4.1 mmol/L (ref 3.5–5.1)
SODIUM: 138 mmol/L (ref 135–145)
Total Bilirubin: 0.9 mg/dL (ref 0.3–1.2)
Total Protein: 7.5 g/dL (ref 6.5–8.1)

## 2015-10-15 LAB — WET PREP, GENITAL
CLUE CELLS WET PREP: NONE SEEN
Sperm: NONE SEEN
Trich, Wet Prep: NONE SEEN
Yeast Wet Prep HPF POC: NONE SEEN

## 2015-10-15 LAB — CHLAMYDIA/NGC RT PCR (ARMC ONLY)
CHLAMYDIA TR: NOT DETECTED
N GONORRHOEAE: NOT DETECTED

## 2015-10-15 LAB — POCT PREGNANCY, URINE: Preg Test, Ur: NEGATIVE

## 2015-10-15 LAB — LIPASE, BLOOD: Lipase: 28 U/L (ref 11–51)

## 2015-10-15 MED ORDER — OXYCODONE-ACETAMINOPHEN 5-325 MG PO TABS
2.0000 | ORAL_TABLET | Freq: Once | ORAL | Status: AC
  Administered 2015-10-15: 2 via ORAL
  Filled 2015-10-15: qty 2

## 2015-10-15 MED ORDER — PHENAZOPYRIDINE HCL 200 MG PO TABS: 200.0000 mg | ORAL_TABLET | Freq: Three times a day (TID) | ORAL | Status: AC | PRN

## 2015-10-15 MED ORDER — SULFAMETHOXAZOLE-TRIMETHOPRIM 800-160 MG PO TABS: 1.0000 | ORAL_TABLET | Freq: Two times a day (BID) | ORAL | Status: DC

## 2015-10-15 MED ORDER — IBUPROFEN 800 MG PO TABS: 800.0000 mg | ORAL_TABLET | Freq: Three times a day (TID) | ORAL | Status: AC | PRN

## 2015-10-15 MED ORDER — AZITHROMYCIN 500 MG PO TABS
1000.0000 mg | ORAL_TABLET | Freq: Once | ORAL | Status: AC
  Administered 2015-10-15: 1000 mg via ORAL
  Filled 2015-10-15: qty 2

## 2015-10-15 MED ORDER — CEFTRIAXONE SODIUM 250 MG IJ SOLR
250.0000 mg | INTRAMUSCULAR | Status: DC
  Administered 2015-10-15: 250 mg via INTRAMUSCULAR
  Filled 2015-10-15: qty 250

## 2015-10-15 MED ORDER — HYDROCODONE-ACETAMINOPHEN 5-325 MG PO TABS
1.0000 | ORAL_TABLET | Freq: Once | ORAL | Status: AC
  Administered 2015-10-15: 1 via ORAL
  Filled 2015-10-15: qty 1

## 2015-10-15 NOTE — ED Notes (Signed)
Pt states lower med abd pain for the past 3 days, pt reports 3 weeks late for her menstral cycle, pt states that she felt like this a year ago and had an ectopic pregnancy, pt denies bleeding or spotting, denies pain with urination, states that she vomited 3 times yesterday, states pain is worse than labor pain, no obvious sign of distress noted at this time

## 2015-10-15 NOTE — ED Provider Notes (Signed)
[facility] Emergency Department Provider Note        Time seen: ----------------------------------------- 8:50 AM on 10/15/2015 -----------------------------------------    I have reviewed the triage vital signs and the nursing notes.   HISTORY  Chief Complaint Abdominal Pain    HPI Brittany Cabrera is a 25 y.o. female who presents ER for abdominal pain for last 3 days. She reports she is 3 weeks late for her menstrual cycle and has not had a home pregnancy test. Patient states she had the symptoms and year ago and had an ectopic pregnancy. She denies any vaginal bleeding or spotting, denies any discharge or STD risk. Patient states she vomited 3 times yesterday. States the pain is worse than labor pain. Nothing made her symptoms better   Past Medical History  Diagnosis Date  . GERD (gastroesophageal reflux disease)     Patient Active Problem List   Diagnosis Date Noted  . Ectopic pregnancy 12/30/2014  . Ectopic pregnancy without intrauterine pregnancy 12/30/2014    Past Surgical History  Procedure Laterality Date  . Diagnostic laparoscopy with removal of ectopic pregnancy Left 12/30/2014    Procedure: D7C, DIAGNOSTIC LAPAROSCOPY, LEFT SALPINGECTOMY, RIGHT PARA OVARIAN CYSTECTOMY;  Surgeon: Smithfield Bing, MD;  Location: [facility] ORS;  Service: Gynecology;  Laterality: Left;    Allergies Review of patient's allergies indicates no known allergies.  Social History Social History  Substance Use Topics  . Smoking status: Never Smoker   . Smokeless tobacco: Never Used  . Alcohol Use: Yes    Review of Systems Constitutional: Negative for fever. Eyes: Negative for visual changes. ENT: Negative for sore throat. Cardiovascular: Negative for chest pain. Respiratory: Negative for shortness of breath. Gastrointestinal: Positive for abdominal pain Genitourinary: Negative for dysuria. Musculoskeletal: Negative for back pain. Skin: Negative for  rash. Neurological: Negative for headaches, focal weakness or numbness.  10-point ROS otherwise negative.  ____________________________________________   PHYSICAL EXAM:  VITAL SIGNS: ED Triage Vitals  Enc Vitals Group     BP 10/15/15 0825 110/70 mmHg     Pulse Rate 10/15/15 0825 90     Resp 10/15/15 0825 16     Temp 10/15/15 0825 98.2 F (36.8 C)     Temp Source 10/15/15 0825 Oral     SpO2 10/15/15 0825 100 %     Weight 10/15/15 0825 194 lb (87.998 kg)     Height 10/15/15 0825  (1.6 m)     Head Cir --      Peak Flow --      Pain Score 10/15/15 0825 8     Pain Loc --      Pain Edu? --      Excl. in GC? --     Constitutional: Alert and oriented. Well appearing and in no distress. Eyes: Conjunctivae are normal. PERRL. Normal extraocular movements. ENT   Head: Normocephalic and atraumatic.   Nose: No congestion/rhinnorhea.   Mouth/Throat: Mucous membranes are moist.   Neck: No stridor. Cardiovascular: Normal rate, regular rhythm. No murmurs, rubs, or gallops. Respiratory: Normal respiratory effort without tachypnea nor retractions. Breath sounds are clear and equal bilaterally. No wheezes/rales/rhonchi. Gastrointestinal: Suprapubic and diffuse pelvic tenderness, no rebound or guarding. Normal bowel sounds. Genitourinary: Musculoskeletal: Nontender with normal range of motion in all extremities. No lower extremity tenderness nor edema. Neurologic:  Normal speech and language. No gross focal neurologic deficits are appreciated.  Skin:  Skin is warm, dry and intact. No rash noted. Psychiatric: Mood and affect are normal.  Speech and behavior are normal.   ____________________________________________  ED COURSE:  Pertinent labs & imaging results that were available during my care of the patient were reviewed by me and considered in my medical decision making (see chart for details). Patient is in no acute distress, will check basic labs, consider  ultrasound ____________________________________________    LABS (pertinent positives/negatives)  Labs Reviewed  WET PREP, GENITAL - Abnormal; Notable for the following:    WBC, Wet Prep HPF POC MANY (*)    All other components within normal limits  COMPREHENSIVE METABOLIC PANEL - Abnormal; Notable for the following:    Glucose, Bld 121 (*)    All other components within normal limits  URINALYSIS COMPLETEWITH MICROSCOPIC ([facility] ONLY) - Abnormal; Notable for the following:    Color, Urine YELLOW (*)    APPearance HAZY (*)    Leukocytes, UA TRACE (*)    Bacteria, UA RARE (*)    Squamous Epithelial / LPF 6-30 (*)    All other components within normal limits  CHLAMYDIA/NGC RT PCR ([facility] ONLY)  CBC WITH DIFFERENTIAL/PLATELET  LIPASE, BLOOD  POC URINE PREG, ED  POCT PREGNANCY, URINE    RADIOLOGY Images were viewed by me  IMPRESSION: Normal pelvic ultrasound. No evidence for ovarian torsion.  ____________________________________________  FINAL ASSESSMENT AND PLAN  Abdominal pain, Cystitis  Plan: Patient with labs and imaging as dictated above. Patient with pelvic pain and UTI. Despite the negative gonorrhea and chlamydia testing I will give Rocephin and Zithromax here. She'll be discharged with Septra and Pyridium for UTI.   [name], Jessalyn Hinojosa E, MD   Note: This dictation was prepared with Dragon dictation. Any transcriptional errors that result from this process are unintentional   Emily FilbertJonathan E Sang Blount, MD 10/15/15 1136

## 2015-10-15 NOTE — Discharge Instructions (Signed)
Pelvic Pain, Female Female pelvic pain can be caused by many different things and start from a variety of places. Pelvic pain refers to pain that is located in the lower half of the abdomen and between your hips. The pain may occur over a short period of time (acute) or may be reoccurring (chronic). The cause of pelvic pain may be related to disorders affecting the female reproductive organs (gynecologic), but it may also be related to the bladder, kidney stones, an intestinal complication, or muscle or skeletal problems. Getting help right away for pelvic pain is important, especially if there has been severe, sharp, or a sudden onset of unusual pain. It is also important to get help right away because some types of pelvic pain can be life threatening.  CAUSES  Below are only some of the causes of pelvic pain. The causes of pelvic pain can be in one of several categories.   Gynecologic.  Pelvic inflammatory disease.  Sexually transmitted infection.  Ovarian cyst or a twisted ovarian ligament (ovarian torsion).  Uterine lining that grows outside the uterus (endometriosis).  Fibroids, cysts, or tumors.  Ovulation.  Pregnancy.  Pregnancy that occurs outside the uterus (ectopic pregnancy).  Miscarriage.  Labor.  Abruption of the placenta or ruptured uterus.  Infection.  Uterine infection (endometritis).  Bladder infection.  Diverticulitis.  Miscarriage related to a uterine infection (septic abortion).  Bladder.  Inflammation of the bladder (cystitis).  Kidney stone(s).  Gastrointestinal.  Constipation.  Diverticulitis.  Neurologic.  Trauma.  Feeling pelvic pain because of mental or emotional causes (psychosomatic).  Cancers of the bowel or pelvis. EVALUATION  Your caregiver will want to take a careful history of your concerns. This includes recent changes in your health, a careful gynecologic history of your periods (menses), and a sexual history. Obtaining  your family history and medical history is also important. Your caregiver may suggest a pelvic exam. A pelvic exam will help identify the location and severity of the pain. It also helps in the evaluation of which organ system may be involved. In order to identify the cause of the pelvic pain and be properly treated, your caregiver may order tests. These tests may include:   A pregnancy test.  Pelvic ultrasonography.  An X-ray exam of the abdomen.  A urinalysis or evaluation of vaginal discharge.  Blood tests. HOME CARE INSTRUCTIONS   Only take over-the-counter or prescription medicines for pain, discomfort, or fever as directed by your caregiver.   Rest as directed by your caregiver.   Eat a balanced diet.   Drink enough fluids to make your urine clear or pale yellow, or as directed.   Avoid sexual intercourse if it causes pain.   Apply warm or cold compresses to the lower abdomen depending on which one helps the pain.   Avoid stressful situations.   Keep a journal of your pelvic pain. Write down when it started, where the pain is located, and if there are things that seem to be associated with the pain, such as food or your menstrual cycle.  Follow up with your caregiver as directed.  SEEK MEDICAL CARE IF:  Your medicine does not help your pain.  You have abnormal vaginal discharge. SEEK IMMEDIATE MEDICAL CARE IF:   You have heavy bleeding from the vagina.   Your pelvic pain increases.   You feel light-headed or faint.   You have chills.   You have pain with urination or blood in your urine.   You have uncontrolled  diarrhea or vomiting.   You have a fever or persistent symptoms for more than 3 days.  You have a fever and your symptoms suddenly get worse.   You are being physically or sexually abused.   This information is not intended to replace advice given to you by your health care provider. Make sure you discuss any questions you have with  your health care provider.   Document Released: 04/11/2004 Document Revised: 02/03/2015 Document Reviewed: 09/04/2011 Elsevier Interactive Patient Education 2016 Elsevier Inc.  Urinary Tract Infection Urinary tract infections (UTIs) can develop anywhere along your urinary tract. Your urinary tract is your body's drainage system for removing wastes and extra water. Your urinary tract includes two kidneys, two ureters, a bladder, and a urethra. Your kidneys are a pair of bean-shaped organs. Each kidney is about the size of your fist. They are located below your ribs, one on each side of your spine. CAUSES Infections are caused by microbes, which are microscopic organisms, including fungi, viruses, and bacteria. These organisms are so small that they can only be seen through a microscope. Bacteria are the microbes that most commonly cause UTIs. SYMPTOMS  Symptoms of UTIs may vary by age and gender of the patient and by the location of the infection. Symptoms in young women typically include a frequent and intense urge to urinate and a painful, burning feeling in the bladder or urethra during urination. Older women and men are more likely to be tired, shaky, and weak and have muscle aches and abdominal pain. A fever may mean the infection is in your kidneys. Other symptoms of a kidney infection include pain in your back or sides below the ribs, nausea, and vomiting. DIAGNOSIS To diagnose a UTI, your caregiver will ask you about your symptoms. Your caregiver will also ask you to provide a urine sample. The urine sample will be tested for bacteria and white blood cells. White blood cells are made by your body to help fight infection. TREATMENT  Typically, UTIs can be treated with medication. Because most UTIs are caused by a bacterial infection, they usually can be treated with the use of antibiotics. The choice of antibiotic and length of treatment depend on your symptoms and the type of bacteria causing  your infection. HOME CARE INSTRUCTIONS  If you were prescribed antibiotics, take them exactly as your caregiver instructs you. Finish the medication even if you feel better after you have only taken some of the medication.  Drink enough water and fluids to keep your urine clear or pale yellow.  Avoid caffeine, tea, and carbonated beverages. They tend to irritate your bladder.  Empty your bladder often. Avoid holding urine for long periods of time.  Empty your bladder before and after sexual intercourse.  After a bowel movement, women should cleanse from front to back. Use each tissue only once. SEEK MEDICAL CARE IF:   You have back pain.  You develop a fever.  Your symptoms do not begin to resolve within 3 days. SEEK IMMEDIATE MEDICAL CARE IF:   You have severe back pain or lower abdominal pain.  You develop chills.  You have nausea or vomiting.  You have continued burning or discomfort with urination. MAKE SURE YOU:   Understand these instructions.  Will watch your condition.  Will get help right away if you are not doing well or get worse.   This information is not intended to replace advice given to you by your health care provider. Make sure you discuss  any questions you have with your health care provider.   Document Released: 02/22/2005 Document Revised: 02/03/2015 Document Reviewed: 06/23/2011 Elsevier Interactive Patient Education [employer].

## 2015-10-22 ENCOUNTER — Encounter (HOSPITAL_COMMUNITY): Payer: Self-pay | Admitting: *Deleted

## 2015-10-22 ENCOUNTER — Emergency Department (HOSPITAL_COMMUNITY)
Admission: EM | Admit: 2015-10-22 | Discharge: 2015-10-22 | Disposition: A | Discharge status: Home / Self Care | Payer: BLUE CROSS/BLUE SHIELD | Attending: Emergency Medicine | Admitting: Emergency Medicine

## 2015-10-22 ENCOUNTER — Emergency Department (HOSPITAL_COMMUNITY): Payer: BLUE CROSS/BLUE SHIELD

## 2015-10-22 DIAGNOSIS — Z79899 Other long term (current) drug therapy: Secondary | ICD-10-CM | POA: Diagnosis not present

## 2015-10-22 DIAGNOSIS — R319 Hematuria, unspecified: Secondary | ICD-10-CM

## 2015-10-22 DIAGNOSIS — R1030 Lower abdominal pain, unspecified: Secondary | ICD-10-CM

## 2015-10-22 DIAGNOSIS — N390 Urinary tract infection, site not specified: Secondary | ICD-10-CM | POA: Diagnosis not present

## 2015-10-22 LAB — COMPREHENSIVE METABOLIC PANEL
ALBUMIN: 3.9 g/dL (ref 3.5–5.0)
ALK PHOS: 32 U/L — AB (ref 38–126)
ALT: 18 U/L (ref 14–54)
AST: 18 U/L (ref 15–41)
Anion gap: 6 (ref 5–15)
BILIRUBIN TOTAL: 0.9 mg/dL (ref 0.3–1.2)
BUN: 13 mg/dL (ref 6–20)
CO2: 24 mmol/L (ref 22–32)
Calcium: 8.8 mg/dL — ABNORMAL LOW (ref 8.9–10.3)
Chloride: 109 mmol/L (ref 101–111)
Creatinine, Ser: 0.74 mg/dL (ref 0.44–1.00)
GFR calc Af Amer: >60 mL/min (ref >60)
GFR calc non Af Amer: >60 mL/min (ref >60)
GLUCOSE: 110 mg/dL — AB (ref 65–99)
Potassium: 3.8 mmol/L (ref 3.5–5.1)
Sodium: 139 mmol/L (ref 135–145)
TOTAL PROTEIN: 6.6 g/dL (ref 6.5–8.1)

## 2015-10-22 LAB — CBC WITH DIFFERENTIAL/PLATELET
BASOS ABS: 0 10*3/uL (ref 0.0–0.1)
BASOS PCT: 0 %
EOS PCT: 5 %
Eosinophils Absolute: 0.4 10*3/uL (ref 0.0–0.7)
HCT: 38.2 % (ref 36.0–46.0)
Hemoglobin: 12.3 g/dL (ref 12.0–15.0)
Lymphocytes Relative: 18 %
Lymphs Abs: 1.5 10*3/uL (ref 0.7–4.0)
MCH: 28.7 pg (ref 26.0–34.0)
MCHC: 32.2 g/dL (ref 30.0–36.0)
MCV: 89 fL (ref 78.0–100.0)
Monocytes Absolute: 0.6 10*3/uL (ref 0.1–1.0)
Monocytes Relative: 7 %
Neutro Abs: 5.7 10*3/uL (ref 1.7–7.7)
Neutrophils Relative %: 70 %
PLATELETS: 267 10*3/uL (ref 150–400)
RBC: 4.29 MIL/uL (ref 3.87–5.11)
RDW: 12 % (ref 11.5–15.5)
WBC: 8.3 10*3/uL (ref 4.0–10.5)

## 2015-10-22 LAB — I-STAT BETA HCG BLOOD, ED (MC, WL, AP ONLY): I-stat hCG, quantitative: <5 m[IU]/mL (ref <5)

## 2015-10-22 LAB — URINE MICROSCOPIC-ADD ON

## 2015-10-22 LAB — URINALYSIS, ROUTINE W REFLEX MICROSCOPIC
Glucose, UA: NEGATIVE mg/dL
Hgb urine dipstick: NEGATIVE
KETONES UR: 15 mg/dL — AB
NITRITE: POSITIVE — AB
PROTEIN: 30 mg/dL — AB
Specific Gravity, Urine: 1.036 — ABNORMAL HIGH (ref 1.005–1.030)
pH: 5 (ref 5.0–8.0)

## 2015-10-22 LAB — LIPASE, BLOOD: Lipase: 23 U/L (ref 11–51)

## 2015-10-22 MED ORDER — ACETAMINOPHEN 325 MG PO TABS: 650.0000 mg | ORAL_TABLET | Freq: Four times a day (QID) | ORAL | Status: AC | PRN

## 2015-10-22 MED ORDER — DEXTROSE 5 % IV SOLN
1.0000 g | Freq: Once | INTRAVENOUS | Status: AC
  Administered 2015-10-22: 1 g via INTRAVENOUS
  Filled 2015-10-22: qty 10

## 2015-10-22 MED ORDER — ONDANSETRON HCL 4 MG/2ML IJ SOLN
4.0000 mg | Freq: Once | INTRAMUSCULAR | Status: AC
  Administered 2015-10-22: 4 mg via INTRAVENOUS
  Filled 2015-10-22: qty 2

## 2015-10-22 MED ORDER — FENTANYL CITRATE (PF) 100 MCG/2ML IJ SOLN
50.0000 ug | Freq: Once | INTRAMUSCULAR | Status: AC
  Administered 2015-10-22: 50 ug via INTRAVENOUS
  Filled 2015-10-22: qty 2

## 2015-10-22 MED ORDER — SODIUM CHLORIDE 0.9 % IV BOLUS (SEPSIS)
1000.0000 mL | Freq: Once | INTRAVENOUS | Status: AC
  Administered 2015-10-22: 1000 mL via INTRAVENOUS

## 2015-10-22 MED ORDER — CEPHALEXIN 500 MG PO CAPS: 500.0000 mg | ORAL_CAPSULE | Freq: Four times a day (QID) | ORAL | Status: AC

## 2015-10-22 MED ORDER — ACETAMINOPHEN 500 MG PO TABS
1000.0000 mg | ORAL_TABLET | Freq: Once | ORAL | Status: AC
  Administered 2015-10-22: 1000 mg via ORAL
  Filled 2015-10-22: qty 2

## 2015-10-22 MED ORDER — IOPAMIDOL (ISOVUE-300) INJECTION 61%
INTRAVENOUS | Status: AC
  Administered 2015-10-22: 100 mL
  Filled 2015-10-22: qty 100

## 2015-10-22 NOTE — ED Notes (Signed)
Pt returned from CT scan, appears to be in no distress but reports the pain is back and severe in lower abdomen. Pt is on cell phone listening to music with eyes closed.

## 2015-10-22 NOTE — ED Notes (Signed)
Pt to ED c/o lower abdominal pain, described as "contractions." Pt was seen [facility] and told pain was from "scar tissue." Denies vaginal bleeding or discharge

## 2015-10-22 NOTE — Discharge Instructions (Signed)
Take medications as prescribed. Return to the emergency room for worsening condition or new concerning symptoms. Follow up with your regular doctor. If you don't have a regular doctor use one of the numbers below to establish a primary care doctor. ° ° °Emergency Department Resource Guide °1) Find a Doctor and Pay Out of Pocket °Although you won't have to find out who is covered by your insurance plan, it is a good idea to ask around and get recommendations. You will then need to call the office and see if the doctor you have chosen will accept you as a new patient and what types of options they offer for patients who are self-pay. Some doctors offer discounts or will set up payment plans for their patients who do not have insurance, but you will need to ask so you aren't surprised when you get to your appointment. ° °2) Contact Your Local Health Department °Not all health departments have doctors that can see patients for sick visits, but many do, so it is worth a call to see if yours does. If you don't know where your local health department is, you can check in your phone book. The CDC also has a tool to help you locate your state's health department, and many state websites also have listings of all of their local health departments. ° °3) Find a Walk-in Clinic °If your illness is not likely to be very severe or complicated, you may want to try a walk in clinic. These are popping up all over the country in pharmacies, drugstores, and shopping centers. They're usually staffed by nurse practitioners or physician assistants that have been trained to treat common illnesses and complaints. They're usually fairly quick and inexpensive. However, if you have serious medical issues or chronic medical problems, these are probably not your best option. ° °No Primary Care Doctor: °- Call [facility] at  832-8000 - they can help you locate a primary care doctor that  accepts your insurance, provides certain services,  etc. °- Physician Referral Service- 1-800-533-3463 ° °Emergency Department Resource Guide °1) Find a Doctor and Pay Out of Pocket °Although you won't have to find out who is covered by your insurance plan, it is a good idea to ask around and get recommendations. You will then need to call the office and see if the doctor you have chosen will accept you as a new patient and what types of options they offer for patients who are self-pay. Some doctors offer discounts or will set up payment plans for their patients who do not have insurance, but you will need to ask so you aren't surprised when you get to your appointment. ° °2) Contact Your Local Health Department °Not all health departments have doctors that can see patients for sick visits, but many do, so it is worth a call to see if yours does. If you don't know where your local health department is, you can check in your phone book. The CDC also has a tool to help you locate your state's health department, and many state websites also have listings of all of their local health departments. ° °3) Find a Walk-in Clinic °If your illness is not likely to be very severe or complicated, you may want to try a walk in clinic. These are popping up all over the country in pharmacies, drugstores, and shopping centers. They're usually staffed by nurse practitioners or physician assistants that have been trained to treat common illnesses and complaints. They're usually fairly   quick and inexpensive. However, if you have serious medical issues or chronic medical problems, these are probably not your best option. ° °No Primary Care Doctor: °- Call [facility] at  832-8000 - they can help you locate a primary care doctor that  accepts your insurance, provides certain services, etc. °- Physician Referral Service- 1-800-533-3463 ° °Chronic Pain Problems: °Organization         Address  Phone   Notes  °[facility]  ([phone]) [phone] Patients need to be referred by  their primary care doctor.  ° °Medication Assistance: °Organization         Address  Phone   Notes  °[place] [facility] [address] °[place], [place] [address] [phone]) [phone] --Must be a resident of [place] °-- Must have NO insurance coverage whatsoever (no Medicaid/ Medicare, etc.) °-- The pt. MUST have a primary care doctor that directs their care regularly and follows them in the community °  °[facility]  (866) 331-1348   °[employer]  (888) 892-1162   ° °Agencies that provide inexpensive medical care: °Organization         Address  Phone   Notes  °[facility]  ([phone]) [phone]   °[facility]    ([phone]) 832-7272   °[facility] [address] °[place], [place] [address] ([phone]) [phone]   °[facility] [address], °[place] ([phone]) [phone]   °[facility]    (336) 373-0678   °[facility]    (336) 272-1050   °[facility] ° [address], [place] Phone:  (336) [phone], Fax:  ([phone]) [phone] Hours of Operation:  9 am - 6 pm, M-F.  Also accepts Medicaid/Medicare and self-pay.  °[facility] ° [address], [place] Phone: (336) [phone], Fax: ([phone]) [phone]. Hours of Operation:  8:30 am - 5:30 pm, M-F.  Also accepts Medicaid and self-pay.  °[facility] [place] [address], [place] Phone: ([phone]) [phone]   °[facility] [address], [place], [place] (336)723-1848, Ext. 123 Mondays & Thursdays: 7-9 AM.  First 15 patients are seen on a first come, first serve basis. °  ° °Medicaid-accepting [place] Providers: ° °Organization         Address  Phone   Notes  °[facility] [address], [place] ([phone]) [phone] Also accepts self-pay patients.  °[facility] [address], [place] ° ([phone]) [phone]   °[facility]  [address], [place] ([phone]) [phone]   °[facility] [address] [place] [address], [place] ([phone]) [phone]   °[facility] [address], [place]  ° ([phone]) [phone] Only accepts [place] Access Medicaid patients after they have their name applied to their card.  ° °Self-Pay (no insurance) in [place]: ° °Organization         Address  Phone   Notes  °Sickle Cell Patients, [facility] [address], [place] ([phone]) [phone]   °[facility] [address], [place] ([phone]) [phone]   °[facility] ° [address], [place] ([phone]) [phone]   °[facility]/Dr. Osei-Bonsu ° [address] [place] [address],  or [address], [place] [phone]) [phone] Phone number   for both [place] and [place] locations is the same.  °[facility] [address], [place] ([phone]) [phone]   °[facility] [address] [place] [address], [place] or [address] ([phone]) [phone] °([phone]) [phone]   °[facility] [address], [place] ([phone]) [phone], phone; ([phone]) [phone], fax Sees patients 1st and 3rd Saturday of every month.  Must not qualify for public or private insurance (i.e. Medicaid, Medicare, [place] Health Choice, Veterans' Benefits) • Household income should be no more than 200% of the poverty level •The clinic cannot treat you if you are pregnant or think you are pregnant • Sexually transmitted diseases are not treated at the clinic.  ° ° ° °

## 2015-10-22 NOTE — ED Provider Notes (Signed)
CSN: [id number]     Arrival date & time 10/22/15  [id number] History   First MD Initiated Contact with Patient 10/22/15 [phone]     Chief Complaint  Patient presents with  . Abdominal Pain    HPI  Brittany Cabrera is an 25 y.o. female with history of GERD who presents to the ED for evaluation of lower abdominal pain. She states she has had lower abdominal pain for ~1 week and was seen [facility] last week, treated for STI/UTI. States she completed one week of Bactrim. She states her symptoms have persisted despite antibiotic therapy. She states if anything the pain is more severe. She describes it as throbbing, contraction-like pain in her lower abdomen. She endorses associated nausea and vomiting. She is unable to quantify how often she is vomiting. She states that some days she is able to tolerate PO but sometimes will have n/v. Denies diarrhea. She denies dysuria, urinary frequency/urgency. Denies vaginal discharge or bleeding.   Past Medical History  Diagnosis Date  . GERD (gastroesophageal reflux disease)    Past Surgical History  Procedure Laterality Date  . Diagnostic laparoscopy with removal of ectopic pregnancy Left 12/30/2014    Procedure: D7C, DIAGNOSTIC LAPAROSCOPY, LEFT SALPINGECTOMY, RIGHT PARA OVARIAN CYSTECTOMY;  Surgeon: [place] Bingharlie Pickens, MD;  Location: [facility] ORS;  Service: Gynecology;  Laterality: Left;   No family history on file. Social History  Substance Use Topics  . Smoking status: Never Smoker   . Smokeless tobacco: Never Used  . Alcohol Use: Yes   OB History    Gravida Para Term Preterm AB TAB SAB Ectopic Multiple Living   2 1 0 0 1 0 0 1 0 1      Review of Systems  All other systems reviewed and are negative.     Allergies  Vicodin  Home Medications   Prior to Admission medications   Medication Sig Start Date End Date Taking? Authorizing Provider  docusate sodium (COLACE) 100 MG capsule Take 1 capsule (100 mg total) by mouth 2 (two) times daily. 12/30/14    [place] Bingharlie Pickens, MD  ibuprofen (ADVIL,MOTRIN) 200 MG tablet Take 800 mg by mouth every 6 (six) hours as needed.    Historical Provider, MD  ibuprofen (ADVIL,MOTRIN) 800 MG tablet Take 1 tablet (800 mg total) by mouth every 8 (eight) hours as needed. 10/15/15   Emily FilbertJonathan E Williams, MD  phenazopyridine (PYRIDIUM) 200 MG tablet Take 1 tablet (200 mg total) by mouth 3 (three) times daily as needed for pain. 10/15/15 10/14/16  Emily FilbertJonathan E Williams, MD  sulfamethoxazole-trimethoprim (BACTRIM DS) 800-160 MG tablet Take 1 tablet by mouth 2 (two) times daily. 10/15/15   Emily FilbertJonathan E Williams, MD   BP 107/73 mmHg  Pulse 79  Temp(Src) 97.9 F (36.6 C) (Oral)  Resp 18  SpO2 96%  LMP 09/21/2015 Physical Exam  Constitutional: She is oriented to person, place, and time.  HENT:  Right Ear: External ear normal.  Left Ear: External ear normal.  Nose: Nose normal.  Mouth/Throat: Oropharynx is clear and moist. No oropharyngeal exudate.  Eyes: Conjunctivae and EOM are normal. Pupils are equal, round, and reactive to light.  Neck: Normal range of motion. Neck supple.  Cardiovascular: Normal rate, regular rhythm, normal heart sounds and intact distal pulses.   Pulmonary/Chest: Effort normal and breath sounds normal. No respiratory distress. She has no wheezes. She exhibits no tenderness.  Abdominal: Soft. Bowel sounds are normal. She exhibits no distension. There is tenderness. There is no rebound.  +suprapubic  and epigastric ttp with guarding. Abdomen soft. No rebound.   Musculoskeletal: She exhibits no edema.  Neurological: She is alert and oriented to person, place, and time. No cranial nerve deficit.  Skin: Skin is warm and dry.  Psychiatric: She has a normal mood and affect.  Nursing note and vitals reviewed.   ED Course  Procedures (including critical care time) Labs Review Labs Reviewed  COMPREHENSIVE METABOLIC PANEL - Abnormal; Notable for the following:    Glucose, Bld 110 (*)    Calcium 8.8 (*)     Alkaline Phosphatase 32 (*)    All other components within normal limits  URINALYSIS, ROUTINE W REFLEX MICROSCOPIC (NOT AT [facility]) - Abnormal;[facility] for the following:    Color, Urine ORANGE (*)    APPearance TURBID (*)    Specific Gravity, Urine 1.036 (*)    Bilirubin Urine SMALL (*)    Ketones, ur 15 (*)    Protein, ur 30 (*)    Nitrite POSITIVE (*)    Leukocytes, UA MODERATE (*)    All other components within normal limits  URINE MICROSCOPIC-ADD ON - Abnormal; Notable for the following:    Squamous Epithelial / LPF TOO NUMEROUS TO COUNT (*)    Bacteria, UA MANY (*)    All other components within normal limits  URINE CULTURE  LIPASE, BLOOD  CBC WITH DIFFERENTIAL/PLATELET  I-STAT BETA HCG BLOOD, ED ([facility], [facility], [facility] ONLY)    Imaging Review Ct Abdomen Pelvis W Contrast  10/22/2015  CLINICAL DATA:  Lower abdominal pain for 1-2 weeks. EXAM: CT ABDOMEN AND PELVIS WITH CONTRAST TECHNIQUE: Multidetector CT imaging of the abdomen and pelvis was performed using the standard protocol following bolus administration of intravenous contrast. CONTRAST:  ISOVUE-300 IOPAMIDOL (ISOVUE-300) INJECTION 61% COMPARISON:  Pelvic ultrasound 10/15/2015 FINDINGS: Lower chest: Lung bases are clear. No effusions. Heart is normal size. Hepatobiliary: No focal hepatic abnormality. Gallbladder unremarkable. Pancreas: No focal abnormality or ductal dilatation. Spleen: No focal abnormality.  Normal size. Adrenals/Urinary Tract: No adrenal abnormality. No focal renal abnormality. No stones or hydronephrosis. Urinary bladder is unremarkable. Stomach/Bowel: Appendix is normal. Stomach, large and small bowel grossly unremarkable. Vascular/Lymphatic: No evidence of aneurysm or adenopathy. Reproductive: Uterus and adnexa unremarkable.  No mass. Other: No free fluid or free air. Musculoskeletal: No acute bony abnormality or focal bone lesion. IMPRESSION: Unremarkable study. Electronically Signed   By: Charlett Nose M.D.   On:  10/22/2015 09:02   I have personally reviewed and evaluated these images and lab results as part of my medical decision-making.   EKG Interpretation None      MDM   Final diagnoses:  Lower abdominal pain  Urinary tract infection with hematuria, site unspecified    Pt is an 25 y.o. female with one week of lower abdominal pain, recently finished course of Bactrim. She continues to have lower abdominal pain and intermittent nausea. Her urine does appear contaminated but with positive nitrites and 6-30 WBC with many bacteria will consider true UTI. CT abdomen/pelvis was obtained due to duration and progression of pain and degree of tenderness with guarding on exam. CT is unremarkable. Pain is well controlled in the ED. Will send urine for culture and change therapy to keflex. Dose of rocephin given in the ED. Pt is hemodynamically stable and nontoxic appearing. Will give resource guide to establish PCP for f/u. ER return precautions given.     Carlene Coria, PA-C 10/22/15 1019  Tilden Fossa, MD 10/24/15 [phone]

## 2015-10-22 NOTE — ED Notes (Signed)
Pt ambulatory to waiting room without any distress. A/o x4. Verbalizes that her mother will be coming to drive her home due to receiving IV pain medication.

## 2015-10-23 LAB — URINE CULTURE

## 2017-02-27 IMAGING — CT CT ABD-PELV W/ CM
2 of 4 series · 11 of 46 positions shown, 12 images · IV contrast (iopamidol)
Comparison: Pelvic ultrasound 10/15/2015

CLINICAL DATA: Lower abdominal pain for 1-2 weeks.

EXAM:
CT ABDOMEN AND PELVIS WITH CONTRAST
TECHNIQUE: Multidetector CT imaging of the abdomen and pelvis was performed
using the standard protocol following bolus administration of
intravenous contrast.
CONTRAST:  100mL [id number] IOPAMIDOL ([id number]) INJECTION 61%

[Series 201: routine, idose (2) · axial · 0.78mm/px · z∈[-656,-251]mm · 8 of 97 slices shown, 9 images]
[im 8/97  soft-tissue]
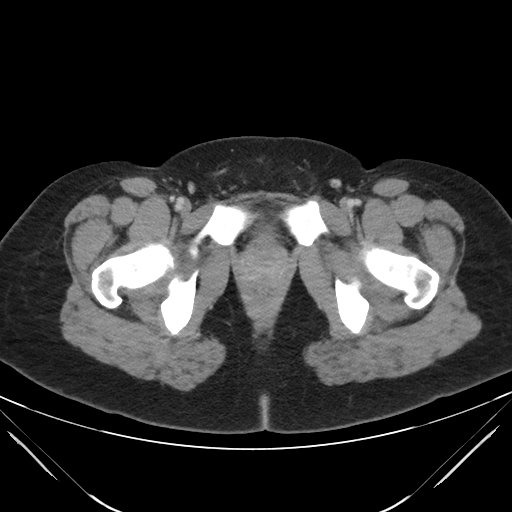
[im 8/97  bone]
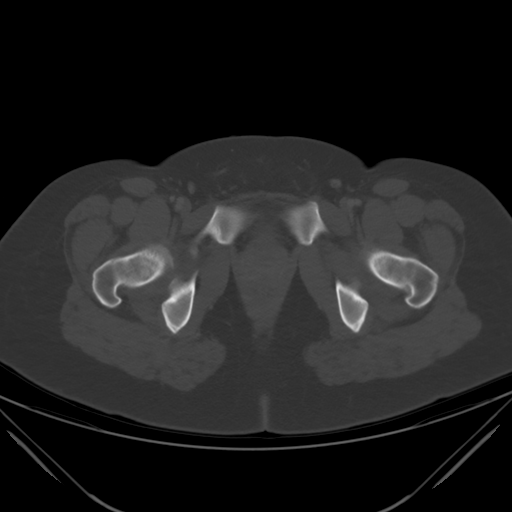
[im 20/97  soft-tissue]
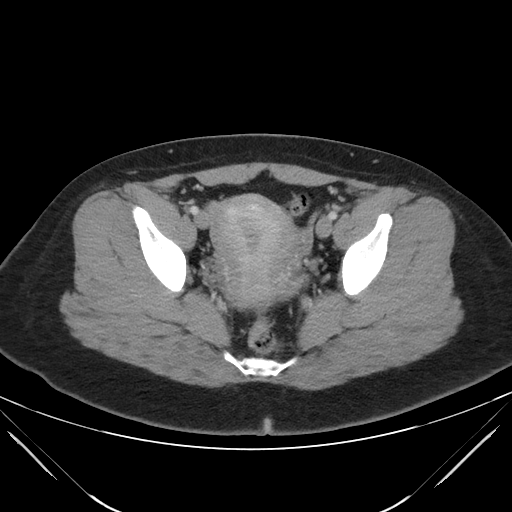
[im 31/97  soft-tissue]
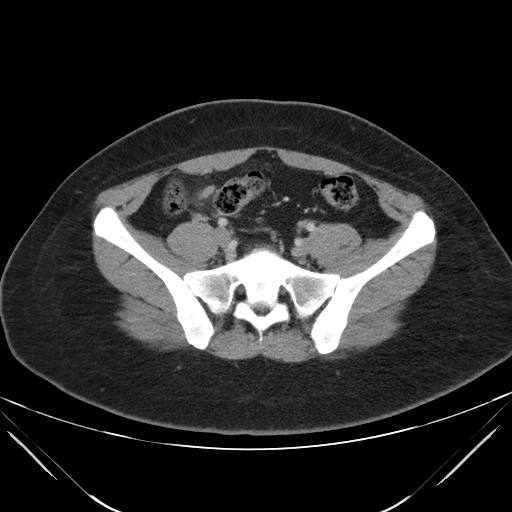
[im 43/97  soft-tissue]
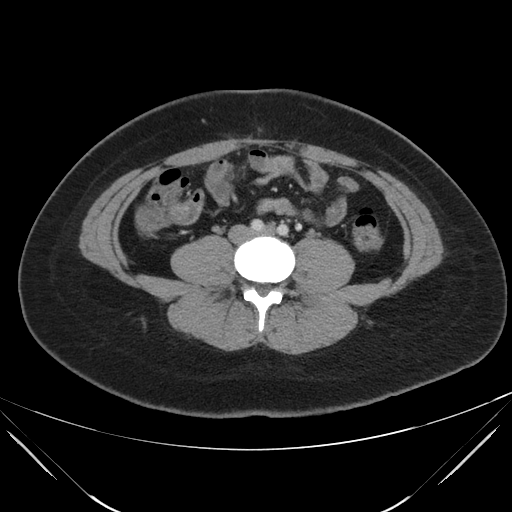
[im 54/97  soft-tissue]
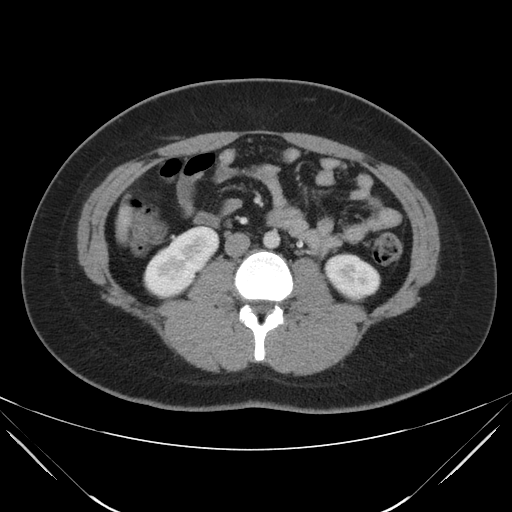
[im 66/97  soft-tissue]
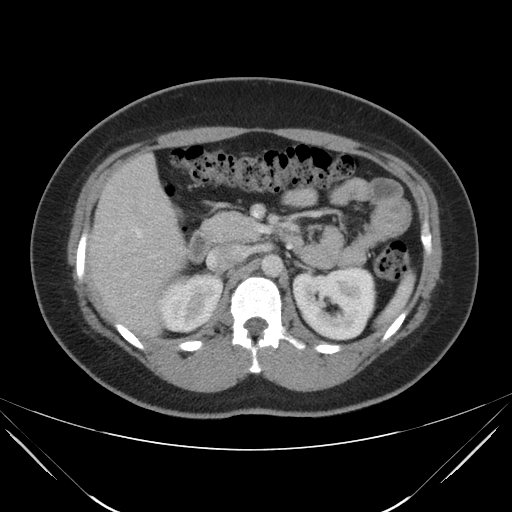
[im 77/97  soft-tissue]
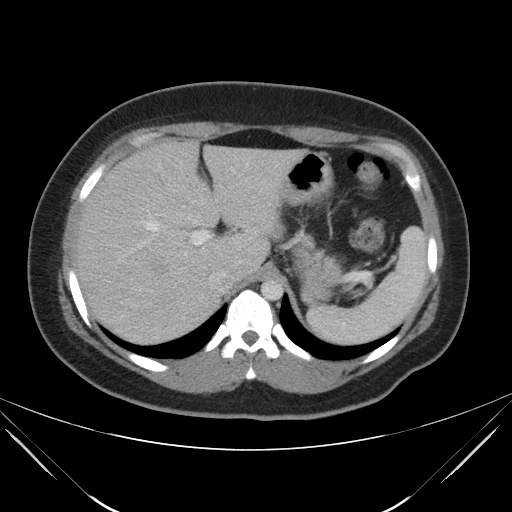
[im 89/97  soft-tissue]
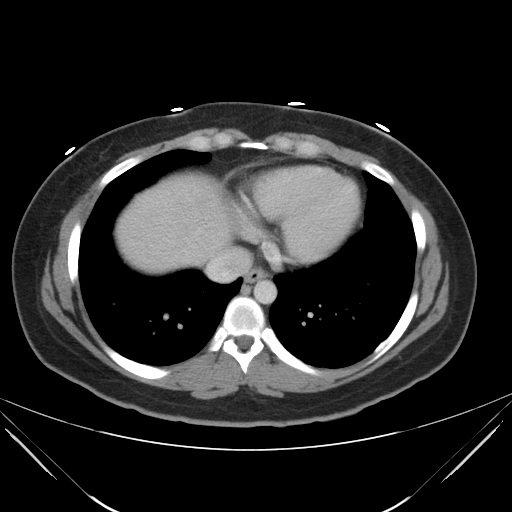

[Series 203: coronals, idose (2) · coronal · 0.45mm/px · 3 of 102 slices shown]
[im 34/102  soft-tissue]
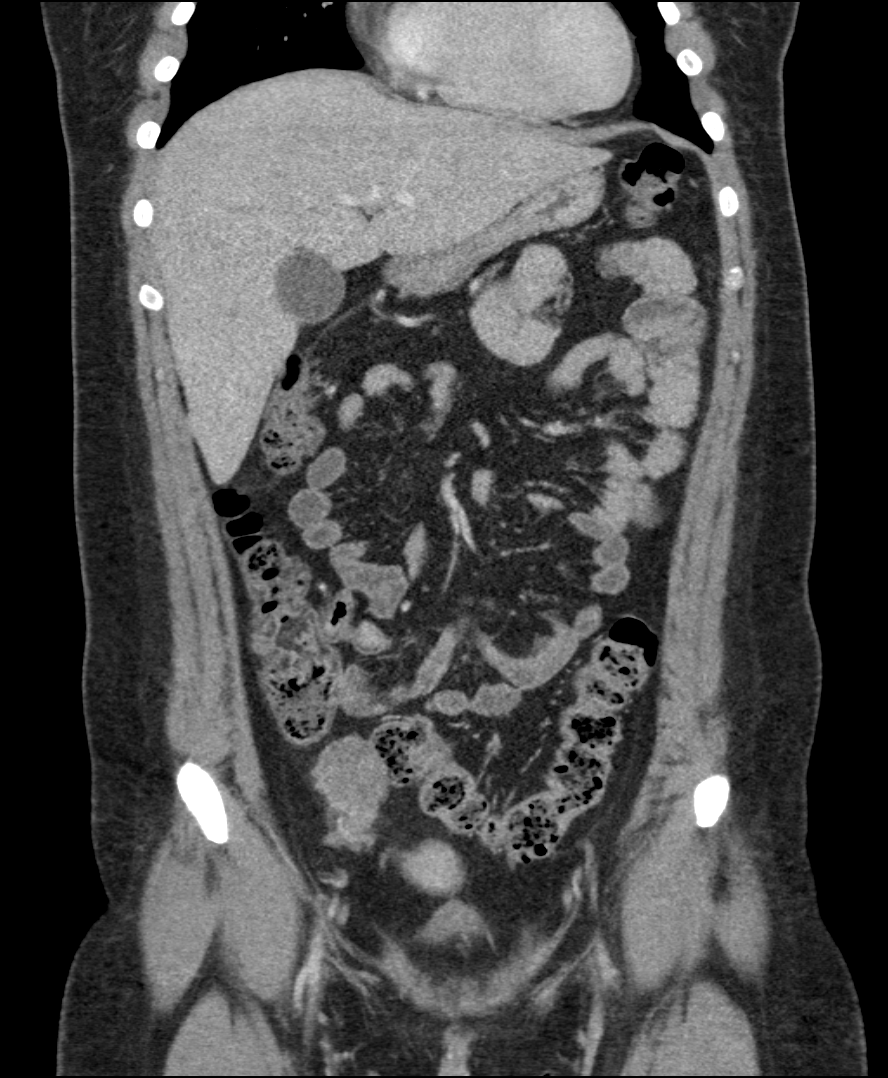
[im 45/102  soft-tissue]
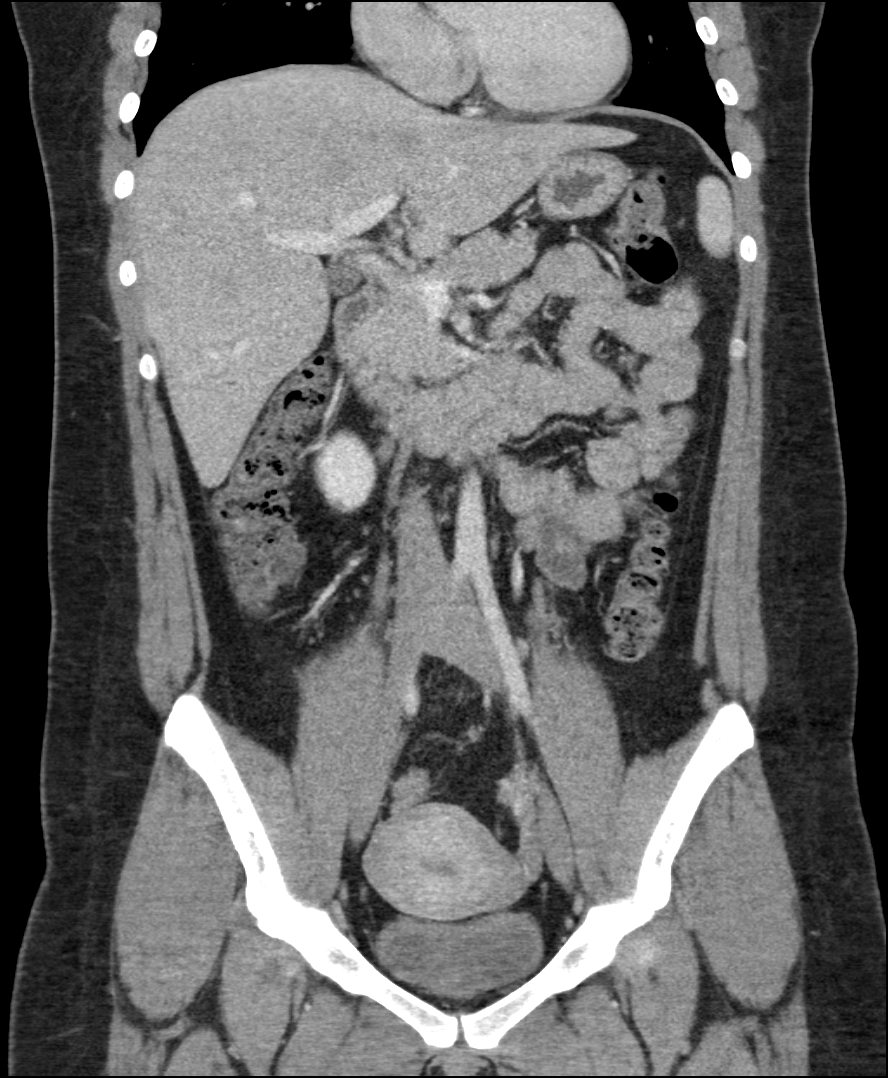
[im 57/102  soft-tissue]
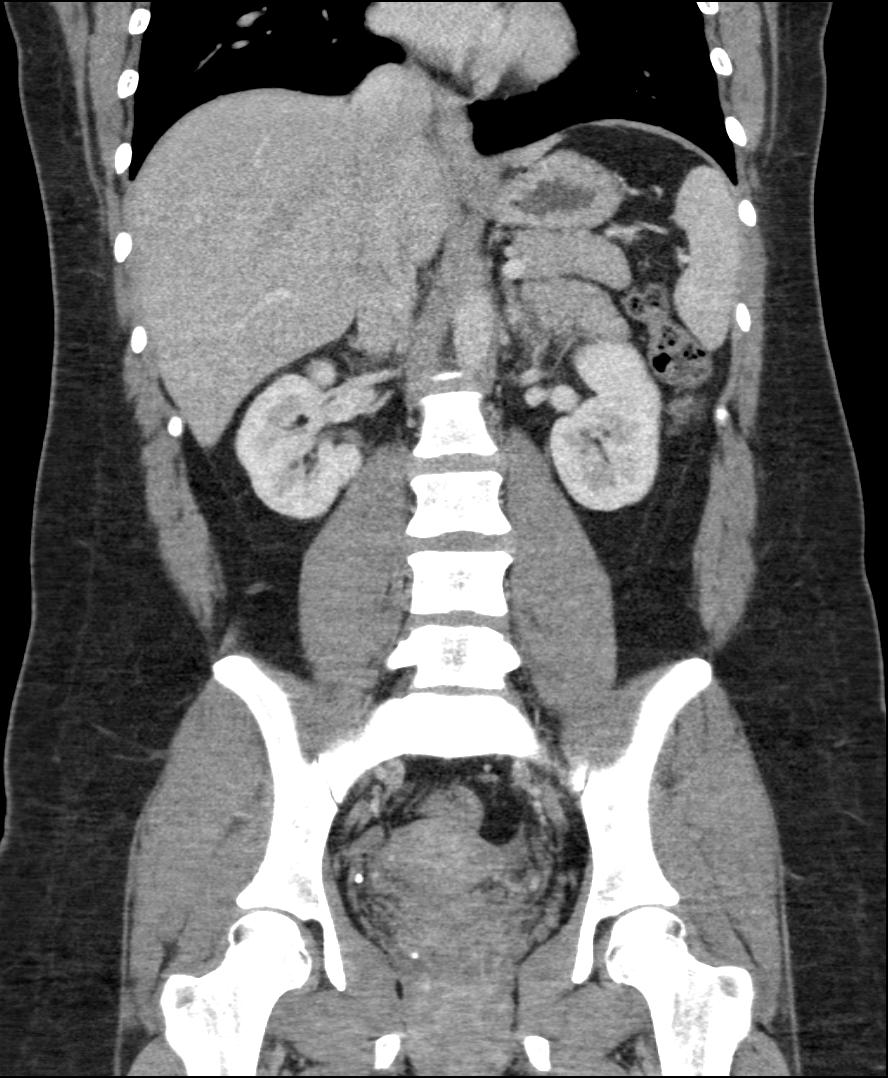

[11 of 46 positions shown; findings below may reference images not displayed]

FINDINGS: Lower chest: Lung bases are clear. No effusions. Heart is normal
size.

Hepatobiliary: No focal hepatic abnormality. Gallbladder
unremarkable.

Pancreas: No focal abnormality or ductal dilatation.

Spleen: No focal abnormality.  Normal size.

Adrenals/Urinary Tract: No adrenal abnormality. No focal renal
abnormality. No stones or hydronephrosis. Urinary bladder is
unremarkable.

Stomach/Bowel: Appendix is normal. Stomach, large and small bowel
grossly unremarkable.

Vascular/Lymphatic: No evidence of aneurysm or adenopathy.

Reproductive: Uterus and adnexa unremarkable.  No mass.

Other: No free fluid or free air.

Musculoskeletal: No acute bony abnormality or focal bone lesion.
IMPRESSION: Unremarkable study.
